# Patient Record
Sex: Female | Born: 1965
Health system: Southern US, Community
[De-identification: ages and names within clinical notes are randomized; demographics above are authoritative.]

## PROBLEM LIST (undated history)

## (undated) DIAGNOSIS — D649 Anemia, unspecified: Secondary | ICD-10-CM

## (undated) DIAGNOSIS — E119 Type 2 diabetes mellitus without complications: Secondary | ICD-10-CM

## (undated) DIAGNOSIS — I10 Essential (primary) hypertension: Secondary | ICD-10-CM

## (undated) DIAGNOSIS — K118 Other diseases of salivary glands: Secondary | ICD-10-CM

## (undated) DIAGNOSIS — D509 Iron deficiency anemia, unspecified: Secondary | ICD-10-CM

## (undated) DIAGNOSIS — R0789 Other chest pain: Secondary | ICD-10-CM

## (undated) HISTORY — DX: Anemia, unspecified: D64.9

## (undated) HISTORY — DX: Type 2 diabetes mellitus without complications: E11.9

## (undated) HISTORY — PX: TUBAL LIGATION: SHX77

## (undated) HISTORY — DX: Essential (primary) hypertension: I10

## (undated) HISTORY — DX: Other diseases of salivary glands: K11.8

## (undated) HISTORY — DX: Other chest pain: R07.89

## (undated) HISTORY — DX: Iron deficiency anemia, unspecified: D50.9

---

## 1997-11-02 ENCOUNTER — Other Ambulatory Visit: Admission: RE | Admit: 1997-11-02 | Discharge: 1997-11-02 | Payer: Self-pay | Admitting: Obstetrics

## 1997-11-23 ENCOUNTER — Ambulatory Visit (HOSPITAL_COMMUNITY): Admission: RE | Admit: 1997-11-23 | Discharge: 1997-11-23 | Payer: Self-pay | Admitting: Obstetrics

## 1998-03-12 ENCOUNTER — Ambulatory Visit (HOSPITAL_COMMUNITY): Admission: RE | Admit: 1998-03-12 | Discharge: 1998-03-12 | Payer: Self-pay | Admitting: Obstetrics

## 1998-06-12 ENCOUNTER — Inpatient Hospital Stay (HOSPITAL_COMMUNITY): Admission: AD | Admit: 1998-06-12 | Discharge: 1998-06-12 | Payer: Self-pay | Admitting: Obstetrics

## 1998-06-14 ENCOUNTER — Inpatient Hospital Stay (HOSPITAL_COMMUNITY): Admission: AD | Admit: 1998-06-14 | Discharge: 1998-06-17 | Payer: Self-pay | Admitting: Obstetrics

## 1998-06-18 ENCOUNTER — Encounter (HOSPITAL_COMMUNITY): Admission: RE | Admit: 1998-06-18 | Discharge: 1998-09-16 | Payer: Self-pay | Admitting: *Deleted

## 1998-09-16 ENCOUNTER — Encounter (HOSPITAL_COMMUNITY): Admission: RE | Admit: 1998-09-16 | Discharge: 1998-12-15 | Payer: Self-pay | Admitting: *Deleted

## 1998-12-16 ENCOUNTER — Encounter (HOSPITAL_COMMUNITY): Admission: RE | Admit: 1998-12-16 | Discharge: 1999-03-16 | Payer: Self-pay | Admitting: *Deleted

## 1999-03-20 ENCOUNTER — Encounter (HOSPITAL_COMMUNITY): Admission: RE | Admit: 1999-03-20 | Discharge: 1999-06-18 | Payer: Self-pay | Admitting: *Deleted

## 2000-05-15 HISTORY — PX: TUBAL LIGATION: SHX77

## 2000-05-21 ENCOUNTER — Other Ambulatory Visit: Admission: RE | Admit: 2000-05-21 | Discharge: 2000-05-21 | Payer: Self-pay | Admitting: Obstetrics

## 2000-08-08 ENCOUNTER — Ambulatory Visit (HOSPITAL_COMMUNITY): Admission: RE | Admit: 2000-08-08 | Discharge: 2000-08-08 | Payer: Self-pay | Admitting: Obstetrics

## 2000-08-08 ENCOUNTER — Encounter: Payer: Self-pay | Admitting: Obstetrics

## 2000-12-25 ENCOUNTER — Encounter (INDEPENDENT_AMBULATORY_CARE_PROVIDER_SITE_OTHER): Payer: Self-pay | Admitting: Specialist

## 2000-12-25 ENCOUNTER — Inpatient Hospital Stay (HOSPITAL_COMMUNITY): Admission: AD | Admit: 2000-12-25 | Discharge: 2000-12-27 | Payer: Self-pay | Admitting: Obstetrics

## 2000-12-28 ENCOUNTER — Encounter: Admission: RE | Admit: 2000-12-28 | Discharge: 2001-01-27 | Payer: Self-pay | Admitting: Obstetrics

## 2001-01-28 ENCOUNTER — Encounter: Admission: RE | Admit: 2001-01-28 | Discharge: 2001-02-27 | Payer: Self-pay | Admitting: Obstetrics

## 2001-03-30 ENCOUNTER — Encounter: Admission: RE | Admit: 2001-03-30 | Discharge: 2001-04-29 | Payer: Self-pay | Admitting: Obstetrics

## 2001-05-30 ENCOUNTER — Encounter: Admission: RE | Admit: 2001-05-30 | Discharge: 2001-06-29 | Payer: Self-pay | Admitting: Obstetrics

## 2001-06-30 ENCOUNTER — Encounter: Admission: RE | Admit: 2001-06-30 | Discharge: 2001-07-30 | Payer: Self-pay | Admitting: Obstetrics

## 2001-08-28 ENCOUNTER — Encounter: Admission: RE | Admit: 2001-08-28 | Discharge: 2001-09-27 | Payer: Self-pay | Admitting: Obstetrics

## 2001-10-28 ENCOUNTER — Encounter: Admission: RE | Admit: 2001-10-28 | Discharge: 2001-11-27 | Payer: Self-pay | Admitting: Obstetrics

## 2001-12-28 ENCOUNTER — Encounter: Admission: RE | Admit: 2001-12-28 | Discharge: 2002-01-27 | Payer: Self-pay | Admitting: Obstetrics

## 2002-01-28 ENCOUNTER — Encounter: Admission: RE | Admit: 2002-01-28 | Discharge: 2002-02-27 | Payer: Self-pay | Admitting: Obstetrics

## 2002-03-30 ENCOUNTER — Encounter: Admission: RE | Admit: 2002-03-30 | Discharge: 2002-04-29 | Payer: Self-pay | Admitting: Obstetrics

## 2002-05-30 ENCOUNTER — Encounter: Admission: RE | Admit: 2002-05-30 | Discharge: 2002-06-29 | Payer: Self-pay | Admitting: Obstetrics

## 2002-06-30 ENCOUNTER — Encounter: Admission: RE | Admit: 2002-06-30 | Discharge: 2002-07-30 | Payer: Self-pay | Admitting: Obstetrics

## 2002-08-29 ENCOUNTER — Encounter: Admission: RE | Admit: 2002-08-29 | Discharge: 2002-09-28 | Payer: Self-pay | Admitting: Obstetrics

## 2004-09-27 ENCOUNTER — Ambulatory Visit (HOSPITAL_COMMUNITY): Admission: RE | Admit: 2004-09-27 | Discharge: 2004-09-27 | Payer: Self-pay | Admitting: Obstetrics

## 2004-10-21 ENCOUNTER — Encounter: Admission: RE | Admit: 2004-10-21 | Discharge: 2004-10-21 | Payer: Self-pay | Admitting: Obstetrics

## 2005-05-04 ENCOUNTER — Encounter: Admission: RE | Admit: 2005-05-04 | Discharge: 2005-05-04 | Payer: Self-pay | Admitting: Obstetrics

## 2005-05-09 ENCOUNTER — Encounter (INDEPENDENT_AMBULATORY_CARE_PROVIDER_SITE_OTHER): Payer: Self-pay | Admitting: *Deleted

## 2005-05-09 ENCOUNTER — Encounter: Admission: RE | Admit: 2005-05-09 | Discharge: 2005-05-09 | Payer: Self-pay | Admitting: Obstetrics

## 2005-09-11 ENCOUNTER — Encounter: Admission: RE | Admit: 2005-09-11 | Discharge: 2005-09-11 | Payer: Self-pay | Admitting: Obstetrics

## 2006-09-14 ENCOUNTER — Encounter: Admission: RE | Admit: 2006-09-14 | Discharge: 2006-09-14 | Payer: Self-pay | Admitting: Obstetrics

## 2007-10-02 ENCOUNTER — Ambulatory Visit (HOSPITAL_COMMUNITY): Admission: RE | Admit: 2007-10-02 | Discharge: 2007-10-02 | Payer: Self-pay | Admitting: Obstetrics

## 2008-09-30 LAB — CONVERTED CEMR LAB: Pap Smear: NORMAL

## 2008-11-19 ENCOUNTER — Ambulatory Visit (HOSPITAL_COMMUNITY): Admission: RE | Admit: 2008-11-19 | Discharge: 2008-11-19 | Payer: Self-pay | Admitting: Obstetrics

## 2009-01-12 ENCOUNTER — Encounter: Payer: Self-pay | Admitting: Internal Medicine

## 2009-01-14 ENCOUNTER — Ambulatory Visit: Payer: Self-pay | Admitting: Internal Medicine

## 2009-01-14 DIAGNOSIS — E1159 Type 2 diabetes mellitus with other circulatory complications: Secondary | ICD-10-CM | POA: Insufficient documentation

## 2009-01-14 DIAGNOSIS — I152 Hypertension secondary to endocrine disorders: Secondary | ICD-10-CM | POA: Insufficient documentation

## 2009-01-14 DIAGNOSIS — I10 Essential (primary) hypertension: Secondary | ICD-10-CM | POA: Insufficient documentation

## 2009-04-05 ENCOUNTER — Ambulatory Visit: Payer: Self-pay | Admitting: Internal Medicine

## 2009-04-05 DIAGNOSIS — R0789 Other chest pain: Secondary | ICD-10-CM

## 2009-04-05 HISTORY — DX: Other chest pain: R07.89

## 2009-10-20 ENCOUNTER — Telehealth: Payer: Self-pay | Admitting: Internal Medicine

## 2009-10-21 ENCOUNTER — Encounter: Payer: Self-pay | Admitting: Internal Medicine

## 2009-10-21 LAB — CONVERTED CEMR LAB
BUN: 8 mg/dL (ref 6–23)
CO2: 28 meq/L (ref 19–32)
Calcium: 8.5 mg/dL (ref 8.4–10.5)
Glucose, Bld: 85 mg/dL (ref 70–99)
Sodium: 141 meq/L (ref 135–145)

## 2009-10-22 ENCOUNTER — Ambulatory Visit: Payer: Self-pay | Admitting: Internal Medicine

## 2009-12-09 ENCOUNTER — Ambulatory Visit (HOSPITAL_COMMUNITY): Admission: RE | Admit: 2009-12-09 | Discharge: 2009-12-09 | Payer: Self-pay | Admitting: Obstetrics

## 2010-01-21 ENCOUNTER — Ambulatory Visit: Payer: Self-pay | Admitting: Internal Medicine

## 2010-03-15 ENCOUNTER — Encounter: Payer: Self-pay | Admitting: Internal Medicine

## 2010-03-15 LAB — CONVERTED CEMR LAB
AST: 20 units/L (ref 0–37)
Albumin: 4 g/dL (ref 3.5–5.2)
Alkaline Phosphatase: 36 units/L — ABNORMAL LOW (ref 39–117)
Basophils Absolute: 0 10*3/uL (ref 0.0–0.1)
Bilirubin, Direct: 0.2 mg/dL (ref 0.0–0.3)
Creatinine, Ser: 0.72 mg/dL (ref 0.40–1.20)
Eosinophils Relative: 1 % (ref 0–5)
Glucose, Bld: 84 mg/dL (ref 70–99)
HCT: 34.8 % — ABNORMAL LOW (ref 36.0–46.0)
HDL: 49 mg/dL (ref >39)
Indirect Bilirubin: 0.4 mg/dL (ref 0.0–0.9)
LDL Cholesterol: 91 mg/dL (ref 0–99)
Lymphocytes Relative: 31 % (ref 12–46)
MCHC: 31.9 g/dL (ref 30.0–36.0)
Monocytes Absolute: 0.4 10*3/uL (ref 0.1–1.0)
Platelets: 248 10*3/uL (ref 150–400)
RBC: 4.29 M/uL (ref 3.87–5.11)
Sodium: 139 meq/L (ref 135–145)
Total Bilirubin: 0.6 mg/dL (ref 0.3–1.2)
Triglycerides: 49 mg/dL (ref <150)
WBC: 5.7 10*3/uL (ref 4.0–10.5)

## 2010-03-17 ENCOUNTER — Ambulatory Visit: Payer: Self-pay | Admitting: Internal Medicine

## 2010-03-18 ENCOUNTER — Telehealth: Payer: Self-pay | Admitting: Internal Medicine

## 2010-03-18 DIAGNOSIS — D509 Iron deficiency anemia, unspecified: Secondary | ICD-10-CM

## 2010-03-18 HISTORY — DX: Iron deficiency anemia, unspecified: D50.9

## 2010-05-17 ENCOUNTER — Ambulatory Visit: Admit: 2010-05-17 | Payer: Self-pay | Admitting: Internal Medicine

## 2010-06-05 ENCOUNTER — Encounter: Payer: Self-pay | Admitting: Obstetrics

## 2010-06-12 LAB — CONVERTED CEMR LAB
UIBC: 311 ug/dL
Vitamin B-12: 711 pg/mL (ref 211–911)

## 2010-06-16 NOTE — Progress Notes (Signed)
Summary: Iron Level  Phone Note Outgoing Call   Summary of Call: call pt - iron levels slightly low.  I suggest pt take OTC iron supplement.  pt should return in 2 months for cbc   Initial call taken by: D. Thomos Lemons DO,  March 18, 2010 5:04 PM  Follow-up for Phone Call        call placed to patient at [phone], no answer, no voice mail.  Follow-up by: Glendell Docker CMA,  March 21, 2010 9:37 AM  Additional Follow-up for Phone Call Additional follow up Details #1::        Pt notified. Order sent to lab for follow up CBC. Nicki Guadalajara Fergerson CMA (AAMA)  March 21, 2010 5:00 PM   New Problems: ANEMIA, IRON DEFICIENCY (ICD-280.9)   New Problems: ANEMIA, IRON DEFICIENCY (ICD-280.9)

## 2010-06-16 NOTE — Assessment & Plan Note (Signed)
Summary: cpx/dt   Vital Signs:  Patient profile:   45 year old female Height:      68 inches Weight:      181.75 pounds BMI:     27.73 O2 Sat:      99 % on Room air Temp:     98.5 degrees F oral Pulse rate:   74 / minute Pulse rhythm:   regular Resp:     18 per minute BP sitting:   126 / 80  (right arm) Cuff size:   large  Vitals Entered By: Glendell Docker CMA (March 17, 2010 8:55 AM)  O2 Flow:  Room air  Contraindications/Deferment of Procedures/Staging:    Test/Procedure: FLU VAX    Reason for deferment: patient declined  CC: CPX Is Patient Diabetic? No Pain Assessment Patient in pain? no        Primary Care Provider:  Dondra Spry DO  CC:  CPX.  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    Physical Exam  General:  alert, well-developed, and well-nourished.   Head:  normocephalic and atraumatic.   Ears:  R ear normal and L ear normal.   Mouth:  pharynx pink and moist.   Neck:  No deformities, masses, or tenderness noted.no carotid bruits.   Lungs:  normal respiratory effort and normal  breath sounds.   Heart:  normal rate, regular rhythm, no murmur, and no gallop.   Abdomen:  soft, non-tender, normal bowel sounds, no masses, no hepatomegaly, and no splenomegaly.   Neurologic:  cranial nerves II-XII intact and gait normal.   Psych:  normally interactive, good eye contact, not anxious appearing, and not depressed appearing.     Impression & Recommendations:  Problem # 1:  HEALTH MAINTENANCE EXAM (ICD-V70.0) Reviewed adult health maintenance protocols.  Mammogram: normal (11/02/2009) Pap smear: normal (11/23/2009) Td Booster: Tdap (01/14/2009)   Flu Vax: Declined (03/17/2010)   Chol: 150 (03/15/2010)   HDL: 49 (03/15/2010)   LDL: 91 (03/15/2010)   TG: 49 (03/15/2010) TSH: 1.484 (03/15/2010)     Problem # 2:  HYPERTENSION (ICD-401.9) Assessment: Improved  Her updated medication list for this problem includes:    Bisoprolol-hydrochlorothiazide 5-6.25 Mg Tabs (Bisoprolol-hydrochlorothiazide) .Marland Kitchen... Take 1 tablet by mouth once a day  BP today: 126/80 Prior BP: 148/92 (10/22/2009)  Labs Reviewed: K+: 4.1 (03/15/2010) Creat: : 0.72 (03/15/2010)   Chol: 150 (03/15/2010)   HDL: 49 (03/15/2010)   LDL: 91 (03/15/2010)   TG: 49 (03/15/2010)  Complete Medication List: 1)  Bisoprolol-hydrochlorothiazide  5-6.25 Mg Tabs (Bisoprolol-hydrochlorothiazide) .... Take 1 tablet by mouth once a day  Patient Instructions: 1)  Please schedule a follow-up appointment in 6 months. Prescriptions: BISOPROLOL-HYDROCHLOROTHIAZIDE 5-6.25 MG TABS (BISOPROLOL-HYDROCHLOROTHIAZIDE) Take 1 tablet by mouth once a day  #90 x 1   Entered and Authorized by:   D. Thomos Lemons DO   Signed by:   D. Thomos Lemons DO on 03/17/2010   Method used:   Electronically to        [facility]* (retail)       [address] [place], [place]  [address]       Ph: [phone]       Fax: [phone]   RxID:   [id number]    Orders Added: 1)  Est. Patient age 46-46  [99396]   Immunization History:  Influenza Immunization History:    Influenza:  declined (03/17/2010)   Immunization History:  Influenza Immunization History:    Influenza:  Declined (03/17/2010)  Current Allergies (reviewed today): No known allergies    Preventive Care Screening  Pap Smear:    Date:  11/23/2009    Results:  normal   Mammogram:    Date:  11/02/2009    Results:  normal

## 2010-06-16 NOTE — Miscellaneous (Signed)
Summary: Lab Orders  Clinical Lists Changes  Orders: Added new Test order of T-Basic Metabolic Panel ([id number]) - Signed 

## 2010-06-16 NOTE — Progress Notes (Signed)
Summary: Refill Denial  Phone Note Call from Patient Call back at Home Phone [phone]   Caller: Patient Reason for Call: Refill Medication Summary of Call: Pt called about refill,  she is out of meds & says she already has OV set up for August, would like to know if she can pu meds before OV Initial call taken by: Lannette Donath,  October 20, 2009 12:28 PM  Follow-up for Phone Call        attempted to contact patient at [phone], no answer. Detailed voice message left informing patient she will need office visit with labs prior to medication refills Follow-up by: Glendell Docker CMA,  October 20, 2009 1:46 PM

## 2010-06-16 NOTE — Assessment & Plan Note (Signed)
Summary: blood pressure meds/mhf   Vital Signs:  Patient profile:   45 year old female Height:      68 inches Weight:      177.75 pounds BMI:     27.12 O2 Sat:      100 % on o Temp:     98.0 degrees F oral Pulse rate:   92 / minute Pulse rhythm:   regular Resp:     16 per minute BP sitting:   148 / 92  (left arm) Cuff size:   regular  Vitals Entered By: Mervin Kung CMA (October 22, 2009 2:46 PM)  O2 Flow:  o CC: Follow up of blood pressure. Needs refill on Bisoprolol-HCTZ; has been out x 2 weeks. Is Patient Diabetic? No   Primary Care Provider:  Dondra Spry DO  CC:  Follow up of blood pressure. Needs refill on Bisoprolol-HCTZ; has been out x 2 weeks.Marland Kitchen  History of Present Illness:  Hypertension Follow-Up      This is a 45 year old woman who presents for Hypertension follow-up.  The patient denies lightheadedness and headaches.  The patient denies the following associated symptoms: chest pain.  Compliance with medications (by patient report) has been near 100%.  The patient reports that dietary compliance has been fair.    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   Lungs:  normal respiratory effort and normal breath sounds.   Heart:  normal rate, regular rhythm, no murmur, and no gallop.     Impression & Recommendations:  Problem # 1:  HYPERTENSION (ICD-401.9) she ran out of her meds.  restart  Her updated medication list for this problem includes:    Bisoprolol-hydrochlorothiazide 5-6.25 Mg Tabs (Bisoprolol-hydrochlorothiazide) .Marland Kitchen... Take 1 tablet by mouth once a day  BP today:  148/92 Prior BP: 138/82 (04/05/2009)  Labs Reviewed: K+: 3.8 (10/21/2009) Creat: : 0.81 (10/21/2009)     Complete Medication List: 1)  Bisoprolol-hydrochlorothiazide 5-6.25 Mg Tabs (Bisoprolol-hydrochlorothiazide) .... Take 1 tablet by mouth once a day  Patient Instructions: 1)  Please schedule a follow-up appointment in 6 months. 2)  Obtain BP cuff.   Prescriptions: BISOPROLOL-HYDROCHLOROTHIAZIDE 5-6.25 MG TABS (BISOPROLOL-HYDROCHLOROTHIAZIDE) Take 1 tablet by mouth once a day  #90 x 1   Entered and Authorized by:   D. Thomos Lemons DO   Signed by:   D. Thomos Lemons DO on 10/22/2009   Method used:   Electronically to        [facility]* (retail)       [address] [place], [place]  [address]       Ph: [phone]       Fax: [phone]   RxID:   [id number]   Current Allergies (reviewed today): No known allergies

## 2010-06-16 NOTE — Miscellaneous (Signed)
Summary: Orders Update  Clinical Lists Changes  Orders: Added new Test order of T-Basic Metabolic Panel ([id number]) - Signed Added new Test order of T-Hepatic Function ([id number]) - Signed Added new Test order of T-Lipid Profile ([id number]) - Signed Added new Test order of T-CBC w/Diff ([id number]) - Signed Added new Test order of T-TSH ([id number]) - Signed  

## 2010-07-05 ENCOUNTER — Emergency Department (HOSPITAL_BASED_OUTPATIENT_CLINIC_OR_DEPARTMENT_OTHER)
Admission: EM | Admit: 2010-07-05 | Discharge: 2010-07-05 | Disposition: A | Discharge status: Home / Self Care | Payer: Self-pay | Attending: Emergency Medicine | Admitting: Emergency Medicine

## 2010-07-05 DIAGNOSIS — R221 Localized swelling, mass and lump, neck: Secondary | ICD-10-CM | POA: Insufficient documentation

## 2010-07-05 DIAGNOSIS — K1120 Sialoadenitis, unspecified: Secondary | ICD-10-CM | POA: Insufficient documentation

## 2010-07-05 DIAGNOSIS — I10 Essential (primary) hypertension: Secondary | ICD-10-CM | POA: Insufficient documentation

## 2010-07-05 DIAGNOSIS — R220 Localized swelling, mass and lump, head: Secondary | ICD-10-CM | POA: Insufficient documentation

## 2010-09-30 NOTE — Op Note (Signed)
[facility] [place]  Patient:    Sarah Humphrey, Sarah Humphrey                      MRN: [id number] Proc. Date: 12/26/00 Adm. Date:  [id number] Attending:  Venita Sheffield                           Operative Report  PREOPERATIVE DIAGNOSIS:       Multiparity.  POSTOPERATIVE DIAGNOSIS:      Multiparity.  OPERATION:                    Postpartum tubal ligation.  SURGEON:                      Kathreen Cosier, M.D.  ANESTHESIA:                   General.  DESCRIPTION OF PROCEDURE:     Patient placed on operating table in supine position.  Her epidural did not work, so general anesthesia was administered by Dr. Arby Barrette.  Abdomen prepped and draped.  Bladder emptied with straight catheter.  A midline subumbilical incision 1 inch long was made, carried down to the fascia.  Fascia cleaned, grasped with two Kochers, and the fascia and peritoneum opened with the Mayo scissors.  The left tube was grasped in the mid portion with a Babcock clamp and traced to the fimbria.   The tube was noted to be very edematous.  A 0 plain suture placed in the mesosalpinx below the portion of the tube within the clamp.  This was tied and approximately 1 inch of tube transected.  The procedure was done in the exact fashion on the other side.  The right tube was also noted to be very edematous, and the tube was also traced to the fimbria.  Lap and sponge counts correct.  Abdomen closed in layers, peritoneum and fascia continuous 0 Dexon, skin closed with subcuticular stitch of 3-0 plain.  The patient tolerated the procedure well and was taken to the recovery room in good condition. DD:  12/26/00 TD:  12/26/00 Job: [id number]

## 2010-09-30 NOTE — H&P (Signed)
[facility]  Patient:    Sarah Humphrey, Sarah Humphrey                MRN: [id number] Adm. Date:  12/25/00 Attending:  Janine Limbo, M.D. Dictator:   Marcelle Smiling. Clelia Croft, C.N.M.                         History and Physical  DATE OF BIRTH:                1965-05-29  CHIEF COMPLAINT:              Ms. Sarah Humphrey is a 45 year old gravida 4 para 3, 0-0-3, at 40-2/7th weeks, who presents with regular uterine contractions for the last several hours.  She reports positive fetal movement. Denies vaginal bleeding, leaking, nausea and vomiting, or visual disturbance. Her pregnancy has been followed by Dr. Gaynell Face and is remarkable for a history of positive group B strep.  PRENATAL LABORATORY DATA:     Hemoglobin 11.2, hematocrit 34.9, platelets 184,000.  Blood type is AB-O, antibody negative.  Sickle cell trait negative. RPR nonreactive.  Hepatitis B surface antigen negative.  Pap smear within normal limits.  Gonorrhea negative.  Chlamydia negative.  One hour glucola on Sep 13, 2000 was 103.  HISTORY OF PRESENT PREGNANCY: She presented for care at approximately [redacted] weeks gestation.  Her prenatal care has been unremarkable.  OBSTETRICAL HISTORY:          1. In 1995 she vaginally delivered a female                                  infant at [redacted] weeks gestation, weighing                                  8 pounds.                               2. In 1998 she vaginally delivered a female                                  infant weighing 8 pounds 8 ounces at [redacted] weeks                                  gestation.                               3. In 1990 she vaginally delivered a female                                  infant at [redacted] weeks gestation without                                  complications.  ALLERGIES:                    No allergies to medications.  PAST MEDICAL HISTORY:  1. She reports having the usual childhood     illnesses.                               2. History of group B strep.  FAMILY HISTORY:               Unremarkable.  GENETIC HISTORY:              Unremarkable.  SOCIAL HISTORY:               She is married.  Her husband is involved and supportive.  They deny any alcohol, tobacco, or illicit drug use with the pregnancy.  PHYSICAL EXAMINATION:  VITAL SIGNS:                  Stable.  She is afebrile.  HEENT:                        Within normal limits.  CHEST:                        Clear to auscultation.  HEART:                        Regular rate and rhythm.  ABDOMEN:                      Gravid contour with uterine contractions every three minutes.  Fetal heart rate is reassuring.  PELVIC:                       Cervix is 6-7 cm, completely effaced, with vertex at -1 station with bulging bag of water.  EXTREMITIES:                  Within normal limits.  ASSESSMENT:                   1. Intrauterine pregnancy at term.                               2. Active labor.                               3. Positive group B streptococci.  PLAN:                         1. Admit to labor and delivery, for consult                                  with Dr. Normand Sloop.                               2. Routine M.D. orders.                               3. Penicillin for group B strep  prophylaxis. DD:  12/25/00 TD:  12/25/00 Job: [id number]

## 2010-09-30 NOTE — Discharge Summary (Signed)
[facility]  Patient:    Sarah Humphrey, Sarah Humphrey                      MRN: [id number] Adm. Date:  [id number] Disc. Date: 12/27/00 Attending:  Venita Sheffield                           Discharge Summary  HISTORY OF PRESENT ILLNESS: The patient is a 45 year old gravida 4 para 3, 0-0-3, [facility] December 23, 2000, admitted in labor, 9 cm dilated, the morning of December 25, 2000.  HOSPITAL COURSE: She had a normal vaginal delivery of a female, Apgars 9 and 9, weighing 9 pounds 10 ounces.  Postpartum the patient desired sterilization and on December 26, 2000 underwent postpartum tubal ligation.  On admission her hemoglobin was 10.9, WBC 8.2; post delivery 8.8 and 10.8.  She did well and was discharged home on the second postpartum day, ambulatory.  DISCHARGE DIET: Regular.  DISCHARGE MEDICATIONS: Tylox one q.4h p.r.n. FOLLOW-UP: To see me in six weeks.  DISCHARGE DIAGNOSES:  1. Status post normal vaginal delivery at term.  2. Postpartum tubal ligation. DD:  12/27/00 TD:  12/27/00 Job: [id number]

## 2010-11-07 ENCOUNTER — Other Ambulatory Visit: Payer: Self-pay | Admitting: Internal Medicine

## 2010-11-07 NOTE — Telephone Encounter (Signed)
Refill sent to pharmacy. Patient due for follow up

## 2013-12-23 ENCOUNTER — Encounter (HOSPITAL_BASED_OUTPATIENT_CLINIC_OR_DEPARTMENT_OTHER): Payer: Self-pay | Admitting: Emergency Medicine

## 2013-12-23 ENCOUNTER — Emergency Department (HOSPITAL_BASED_OUTPATIENT_CLINIC_OR_DEPARTMENT_OTHER)
Admission: EM | Admit: 2013-12-23 | Discharge: 2013-12-23 | Disposition: A | Discharge status: Home / Self Care | Payer: Self-pay | Attending: Emergency Medicine | Admitting: Emergency Medicine

## 2013-12-23 DIAGNOSIS — Y929 Unspecified place or not applicable: Secondary | ICD-10-CM | POA: Insufficient documentation

## 2013-12-23 DIAGNOSIS — Z79899 Other long term (current) drug therapy: Secondary | ICD-10-CM | POA: Insufficient documentation

## 2013-12-23 DIAGNOSIS — Y9389 Activity, other specified: Secondary | ICD-10-CM | POA: Insufficient documentation

## 2013-12-23 DIAGNOSIS — I10 Essential (primary) hypertension: Secondary | ICD-10-CM | POA: Insufficient documentation

## 2013-12-23 DIAGNOSIS — IMO0002 Reserved for concepts with insufficient information to code with codable children: Secondary | ICD-10-CM | POA: Insufficient documentation

## 2013-12-23 DIAGNOSIS — T169XXA Foreign body in ear, unspecified ear, initial encounter: Secondary | ICD-10-CM | POA: Insufficient documentation

## 2013-12-23 DIAGNOSIS — T162XXA Foreign body in left ear, initial encounter: Secondary | ICD-10-CM

## 2013-12-23 HISTORY — DX: Essential (primary) hypertension: I10

## 2013-12-23 NOTE — ED Notes (Signed)
MD at bedside. 

## 2013-12-23 NOTE — ED Notes (Signed)
Pt c/o cotton from q tip in left ear x 1 hr

## 2013-12-23 NOTE — Discharge Instructions (Signed)
Ear Foreign Body °An ear foreign body is an object that is stuck in the ear. It is common for young children to put objects into the ear canal. These may include pebbles, beads, beans, and any other small objects which will fit. In adults, objects such as cotton swabs may become lodged in the ear canal. In all ages, the most common foreign bodies are insects that enter the ear canal.  °SYMPTOMS  °Foreign bodies may cause pain, buzzing or roaring sounds, hearing loss, and ear drainage.  °HOME CARE INSTRUCTIONS  °· Keep all follow-up appointments with your caregiver as told. °· Keep small objects out of reach of young children. Tell them not to put anything in their ears. °SEEK IMMEDIATE MEDICAL CARE IF:  °· You have bleeding from the ear. °· You have increased pain or swelling of the ear. °· You have reduced hearing. °· You have discharge coming from the ear. °· You have a fever. °· You have a headache. °MAKE SURE YOU:  °· Understand these instructions. °· Will watch your condition. °· Will get help right away if you are not doing well or get worse. °Document Released: 04/28/2000 Document Revised: 07/24/2011 Document Reviewed: 12/18/2007 °ExitCare® Patient Information ©2015 [employer]. This information is not intended to replace advice given to you by your health care provider. Make sure you discuss any questions you have with your health care provider. ° °

## 2013-12-23 NOTE — ED Provider Notes (Signed)
CSN: [id number]     Arrival date & time 12/23/13  0009 History  This chart was scribed for Julianne Rice, MD by Jeanell Sparrow, ED Scribe. This patient was seen in room MH10/MH10 and the patient's care was started at 12:31 AM.   Chief Complaint  Patient presents with  . Foreign Body in Ear   HPI HPI Comments: Sarah Humphrey is a 48 y.o. female who presents to the Emergency Department complaining of a foreign body in her left ear that occurred about an hour ago. She states that a q tip got stuck in her left ear. She reports that she feel the q tip in her ear. She denies any ear pain or hearing changes  Past Medical History  Diagnosis Date  . Hypertension    Past Surgical History  Procedure Laterality Date  . Tubal ligation     History reviewed. No pertinent family history. History  Substance Use Topics  . Smoking status: Never Smoker   . Smokeless tobacco: Not on file  . Alcohol Use: No   OB History   Grav Para Term Preterm Abortions TAB SAB Ect Mult Living                 Review of Systems  HENT: Negative for ear discharge, ear pain and hearing loss.   All other systems reviewed and are negative.   Allergies  Review of patient's allergies indicates no known allergies.  Home Medications   Prior to Admission medications   Medication Sig Start Date End Date Taking? Authorizing Provider  bisoprolol-hydrochlorothiazide (ZIAC) 5-6.25 MG per tablet TAKE ONE TABLET BY MOUTH EVERY DAY 11/07/10   Doe-Hyun R Shawna Orleans, DO   BP 175/100  Pulse 74  Temp(Src) 97.8 F (36.6 C) (Oral)  Resp 16  Ht 5\' 7"  (1.702 m)  Wt 201 lb (91.173 kg)  BMI 31.47 kg/m2  SpO2 100%  LMP 12/23/2013 Physical Exam  Nursing note and vitals reviewed. Constitutional: She is oriented to person, place, and time. She appears well-developed and well-nourished. No distress.  HENT:  Head: Normocephalic and atraumatic.  Cotton ball visualized in the left external auditory meatus. No obvious surrounding erythema  or trauma.  Eyes: EOM are normal. Pupils are equal, round, and reactive to light.  Neck: Normal range of motion. Neck supple.  Cardiovascular: Normal rate.   Pulmonary/Chest: Effort normal.  Abdominal: Soft. Bowel sounds are normal.  Musculoskeletal: Normal range of motion. She exhibits no edema and no tenderness.  Neurological: She is alert and oriented to person, place, and time.  Skin: Skin is warm and dry. No rash noted. No erythema.  Psychiatric: She has a normal mood and affect. Her behavior is normal.    ED Course  Procedures (including critical care time) DIAGNOSTIC STUDIES: Oxygen Saturation is 100% on RA, normal by my interpretation.    COORDINATION OF CARE: 12:35 AM- Pt advised of plan for treatment and pt agrees.  Labs Review Labs Reviewed - No data to display  Imaging Review No results found.   EKG Interpretation None     Cotton ball removed in the emergency department with forceps. Re-exam: Left TM intact without any erythema or evidence of trauma. MDM   Final diagnoses:  None   I personally performed the services described in this documentation, which was scribed in my presence. The recorded information has been reviewed and is accurate.       Julianne Rice, MD 12/23/13 [phone]

## 2014-03-05 ENCOUNTER — Telehealth: Payer: Self-pay | Admitting: Internal Medicine

## 2014-04-28 ENCOUNTER — Encounter: Payer: Self-pay | Admitting: Family Medicine

## 2014-04-28 ENCOUNTER — Ambulatory Visit (INDEPENDENT_AMBULATORY_CARE_PROVIDER_SITE_OTHER): Payer: 59 | Admitting: Family Medicine

## 2014-04-28 VITALS — BP 128/90 | HR 67 | Temp 98.1°F | Ht 67.25 in | Wt 196.8 lb

## 2014-04-28 DIAGNOSIS — Z7689 Persons encountering health services in other specified circumstances: Secondary | ICD-10-CM

## 2014-04-28 DIAGNOSIS — Z7189 Other specified counseling: Secondary | ICD-10-CM

## 2014-04-28 DIAGNOSIS — I10 Essential (primary) hypertension: Secondary | ICD-10-CM

## 2014-04-28 DIAGNOSIS — D509 Iron deficiency anemia, unspecified: Secondary | ICD-10-CM

## 2014-04-28 DIAGNOSIS — M542 Cervicalgia: Secondary | ICD-10-CM

## 2014-04-28 LAB — CBC WITH DIFFERENTIAL/PLATELET
BASOS ABS: 0 10*3/uL (ref 0.0–0.1)
Basophils Relative: 0.5 % (ref 0.0–3.0)
EOS ABS: 0.1 10*3/uL (ref 0.0–0.7)
EOS PCT: 1.9 % (ref 0.0–5.0)
HEMATOCRIT: 33.2 % — AB (ref 36.0–46.0)
HEMOGLOBIN: 10.5 g/dL — AB (ref 12.0–15.0)
LYMPHS PCT: 26.7 % (ref 12.0–46.0)
Lymphs Abs: 1.9 10*3/uL (ref 0.7–4.0)
MCHC: 31.7 g/dL (ref 30.0–36.0)
MCV: 78.2 fl (ref 78.0–100.0)
MONOS PCT: 8.1 % (ref 3.0–12.0)
Monocytes Absolute: 0.6 10*3/uL (ref 0.1–1.0)
Neutro Abs: 4.4 10*3/uL (ref 1.4–7.7)
Neutrophils Relative %: 62.8 % (ref 43.0–77.0)
PLATELETS: 231 10*3/uL (ref 150.0–400.0)
RBC: 4.24 Mil/uL (ref 3.87–5.11)
RDW: 17.4 % — ABNORMAL HIGH (ref 11.5–15.5)
WBC: 7 10*3/uL (ref 4.0–10.5)

## 2014-04-28 LAB — BASIC METABOLIC PANEL
BUN: 13 mg/dL (ref 6–23)
CO2: 26 mEq/L (ref 19–32)
Calcium: 9.1 mg/dL (ref 8.4–10.5)
Chloride: 103 mEq/L (ref 96–112)
Creatinine, Ser: 0.8 mg/dL (ref 0.4–1.2)
GFR: 102.47 mL/min (ref >60.00)
Glucose, Bld: 81 mg/dL (ref 70–99)
POTASSIUM: 3.2 meq/L — AB (ref 3.5–5.1)
SODIUM: 136 meq/L (ref 135–145)

## 2014-04-28 LAB — LIPID PANEL
CHOLESTEROL: 150 mg/dL (ref 0–200)
HDL: 35.7 mg/dL — AB (ref >39.00)
LDL CALC: 101 mg/dL — AB (ref 0–99)
NonHDL: 114.3
TRIGLYCERIDES: 68 mg/dL (ref 0.0–149.0)
Total CHOL/HDL Ratio: 4
VLDL: 13.6 mg/dL (ref 0.0–40.0)

## 2014-04-28 LAB — HEMOGLOBIN A1C: Hgb A1c MFr Bld: 6.6 % — ABNORMAL HIGH (ref 4.6–6.5)

## 2014-04-28 NOTE — Patient Instructions (Signed)
BEFORE YOU LEAVE: -labs -follow up in 3-4 months  Schedule you gynecology annual exam  -We have ordered labs or studies at this visit. It can take up to 1-2 weeks for results and processing. We will contact you with instructions IF your results are abnormal. Normal results will be released to your [facility]. If you have not heard from [place] or can not find your results in [facility] in 2 weeks please contact our office.  -PLEASE SIGN UP FOR MYCHART TODAY   We recommend the following healthy lifestyle measures: - eat a healthy diet consisting of lots of vegetables, fruits, beans, nuts, seeds, healthy meats such as white chicken and fish and whole grains.  - avoid fried foods, fast food, processed foods, sodas, red meet and other fattening foods.  - get a least 150 minutes of aerobic exercise per week.

## 2014-04-28 NOTE — Progress Notes (Signed)
HPI:  Sarah Humphrey is here to establish care.  Last PCP and physical: does have a gynecologist -she is planning to continue her gyn exam there.  Has the following chronic problems that require follow up and concerns today:  Neck muscle strain: -for 1 week  -thinks slept wrong -pain and tension in muscles in neck and shoulders -improving -denies: weakness, numbness, radiation, malaise  HTN: -meds bisoprolol - hctz 5-6.25 -Denies: CP, SOB, DOE, swelling, palpitations  Hx iron def anemia: -chronic - dx many years ago, reports on iron pills in the past -does not eat red meat - healthy diet, some [place] or chicken -reports regular monthly periods, bleeds 4-5 days heavy, then slows down, sometimes does have bleeding requiring changing pads every 1-2 hours, but sometimes lighter, does not use tampons  ROS negative for unless reported above: fevers, unintentional weight loss, hearing or vision loss, chest pain, palpitations, struggling to breath, hemoptysis, melena, hematochezia, hematuria, falls, loc, si, thoughts of self harm  Past Medical History  Diagnosis Date  . Hypertension   . CHEST PAIN, ATYPICAL 04/05/2009    Qualifier: Diagnosis of  By: Wynona Luna   . ANEMIA, IRON DEFICIENCY 03/18/2010    Qualifier: Diagnosis of  By: Wynona Luna     Past Surgical History  Procedure Laterality Date  . Tubal ligation      History reviewed. No pertinent family history.  History   Social History  . Marital Status: Married    Spouse Name: N/A    Number of Children: N/A  . Years of Education: N/A   Social History Main Topics  . Smoking status: Never Smoker   . Smokeless tobacco: None  . Alcohol Use: No  . Drug Use: No  . Sexual Activity: No   Other Topics Concern  . 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  GENERAL: vitals reviewed and listed above, alert, oriented, appears well hydrated and in no acute distress  HEENT: atraumatic, conjunttiva clear, no obvious abnormalities on inspection of external nose and ears  NECK: no obvious masses on inspection  LUNGS: clear to auscultation bilaterally, no wheezes, rales or rhonchi, good air movement  CV: HRRR, no peripheral edema  MS: moves all extremities without noticeable abnormality  PSYCH: pleasant and cooperative, no obvious depression or anxiety  ASSESSMENT AND PLAN:  Discussed the following assessment and plan:  Encounter to establish care  Essential hypertension - Plan: Lipid Panel, Basic metabolic panel, Hemoglobin A1c  Iron deficiency anemia - Plan: CBC with Differential  Neck pain  -We reviewed the PMH, PSH, FH, SH, Meds and Allergies. -We provided refills for any medications we will prescribe as needed. -We addressed current concerns per orders and patient instructions. -We have asked for records for pertinent exams, studies, vaccines and notes from previous providers. -We have advised  patient to follow up per instructions below. -FASTING today so she opted to do basi labs related to her HTN and hx anemia    -Patient advised to return or notify a doctor immediately if symptoms worsen or persist or new concerns arise.  Patient Instructions  BEFORE YOU LEAVE: -labs -follow up in 3-4 months  Schedule you gynecology annual exam  -We have ordered labs or studies at this visit. It can take up to 1-2 weeks for results and processing. We will contact you with  instructions IF your results are abnormal. Normal results will be released to your [facility]. If you have not heard from [place] or can not find your results in [facility] in 2 weeks please contact our office.  -PLEASE SIGN UP FOR MYCHART TODAY   We recommend the following healthy lifestyle measures: - eat a healthy diet consisting of lots of vegetables, fruits, beans, nuts, seeds, healthy meats such as white chicken and fish and whole grains.  - avoid fried foods, fast food, processed foods, sodas, red meet and other fattening foods.  - get a least 150 minutes of aerobic exercise per week.        Colin Benton R.

## 2014-04-28 NOTE — Progress Notes (Signed)
Pre visit review using our clinic review tool, if applicable. No additional management support is needed unless otherwise documented below in the visit note. 

## 2014-04-29 ENCOUNTER — Telehealth: Payer: Self-pay | Admitting: Family Medicine

## 2014-04-29 ENCOUNTER — Other Ambulatory Visit: Payer: Self-pay | Admitting: Family Medicine

## 2014-04-29 MED ORDER — BISOPROLOL-HYDROCHLOROTHIAZIDE 5-6.25 MG PO TABS: 1.0000 | ORAL_TABLET | Freq: Every day | ORAL | Status: DC

## 2014-04-29 NOTE — Telephone Encounter (Signed)
emmi emailed °

## 2014-08-26 ENCOUNTER — Ambulatory Visit: Payer: Self-pay | Admitting: Family Medicine

## 2015-03-12 NOTE — Telephone Encounter (Signed)
error 

## 2015-04-14 ENCOUNTER — Other Ambulatory Visit: Payer: Self-pay | Admitting: Family Medicine

## 2015-06-01 ENCOUNTER — Ambulatory Visit (INDEPENDENT_AMBULATORY_CARE_PROVIDER_SITE_OTHER): Payer: Self-pay | Admitting: Family Medicine

## 2015-06-01 ENCOUNTER — Encounter: Payer: Self-pay | Admitting: Family Medicine

## 2015-06-01 VITALS — BP 144/98 | HR 91 | Temp 98.6°F | Ht 67.25 in | Wt 193.6 lb

## 2015-06-01 DIAGNOSIS — D509 Iron deficiency anemia, unspecified: Secondary | ICD-10-CM

## 2015-06-01 DIAGNOSIS — I10 Essential (primary) hypertension: Secondary | ICD-10-CM

## 2015-06-01 DIAGNOSIS — E785 Hyperlipidemia, unspecified: Secondary | ICD-10-CM

## 2015-06-01 DIAGNOSIS — E119 Type 2 diabetes mellitus without complications: Secondary | ICD-10-CM

## 2015-06-01 LAB — CBC WITH DIFFERENTIAL/PLATELET
BASOS ABS: 0 10*3/uL (ref 0.0–0.1)
BASOS PCT: 0.4 % (ref 0.0–3.0)
EOS ABS: 0.1 10*3/uL (ref 0.0–0.7)
Eosinophils Relative: 1.2 % (ref 0.0–5.0)
HEMATOCRIT: 37.8 % (ref 36.0–46.0)
HEMOGLOBIN: 12.1 g/dL (ref 12.0–15.0)
LYMPHS PCT: 37.6 % (ref 12.0–46.0)
Lymphs Abs: 2.3 10*3/uL (ref 0.7–4.0)
MCHC: 32 g/dL (ref 30.0–36.0)
MCV: 81.5 fl (ref 78.0–100.0)
Monocytes Absolute: 0.4 10*3/uL (ref 0.1–1.0)
Monocytes Relative: 6.2 % (ref 3.0–12.0)
Neutro Abs: 3.3 10*3/uL (ref 1.4–7.7)
Neutrophils Relative %: 54.6 % (ref 43.0–77.0)
Platelets: 254 10*3/uL (ref 150.0–400.0)
RBC: 4.63 Mil/uL (ref 3.87–5.11)
RDW: 15 % (ref 11.5–15.5)
WBC: 6 10*3/uL (ref 4.0–10.5)

## 2015-06-01 LAB — LIPID PANEL
CHOL/HDL RATIO: 4
Cholesterol: 142 mg/dL (ref 0–200)
HDL: 36.9 mg/dL — AB (ref >39.00)
LDL Cholesterol: 92 mg/dL (ref 0–99)
NONHDL: 105.38
Triglycerides: 67 mg/dL (ref 0.0–149.0)
VLDL: 13.4 mg/dL (ref 0.0–40.0)

## 2015-06-01 LAB — BASIC METABOLIC PANEL
BUN: 10 mg/dL (ref 6–23)
CHLORIDE: 103 meq/L (ref 96–112)
CO2: 31 mEq/L (ref 19–32)
CREATININE: 0.76 mg/dL (ref 0.40–1.20)
Calcium: 9.3 mg/dL (ref 8.4–10.5)
GFR: 103.56 mL/min (ref >60.00)
Glucose, Bld: 89 mg/dL (ref 70–99)
Potassium: 3.4 mEq/L — ABNORMAL LOW (ref 3.5–5.1)
Sodium: 140 mEq/L (ref 135–145)

## 2015-06-01 LAB — HEMOGLOBIN A1C: HEMOGLOBIN A1C: 6.2 % (ref 4.6–6.5)

## 2015-06-01 MED ORDER — BISOPROLOL-HYDROCHLOROTHIAZIDE 5-6.25 MG PO TABS: 1.0000 | ORAL_TABLET | Freq: Every day | ORAL | Status: DC

## 2015-06-01 NOTE — Progress Notes (Signed)
Pre visit review using our clinic review tool, if applicable. No additional management support is needed unless otherwise documented below in the visit note. 

## 2015-06-01 NOTE — Patient Instructions (Addendum)
BEFORE YOU LEAVE: -schedule physical exam in 3 months -labs  Restart your blood pressure medication  Get a yearly diabetic eye exam from an eye doctor  Get regular dental care  We recommend the following healthy lifestyle measures: - eat a healthy whole foods diet consisting of regular small meals composed of vegetables, fruits, beans, nuts, seeds, healthy meats such as white chicken and fish and whole grains.  - avoid sweets, white starchy foods, fried foods, fast food, processed foods, sodas, red meet and other fattening foods.  - get a least 150-300 minutes of aerobic exercise per week.   -We have ordered labs or studies at this visit. It can take up to 1-2 weeks for results and processing. We will contact you with instructions IF your results are abnormal. Normal results will be released to your [facility]. If you have not heard from [place] or can not find your results in [facility] in 2 weeks please contact our office.

## 2015-06-01 NOTE — Progress Notes (Signed)
HPI:  Sarah Humphrey is a pleasant 50 yo whom I have not seen in > 1 year here for a follow up visit for:  HTN: -meds bisoprolol - hctz 5-6.25 -reports ran out of BP medication -Denies: CP, SOB, DOE, swelling, palpitations  Diabetes/HLD: -she did not follow up after this dx, has been > 1 year -no regular exercise  -no change in diet, did not not see diabetes educator  Hx iron def anemia: -chronic - dx many years ago, reports on iron pills in the past -does not eat red meat - healthy diet, some [place] or chicken -reports regular monthly periods, bleeds 4-5 days heavy, then slows down, sometimes does have bleeding requiring changing pads every 1-2 hours, but sometimes lighter, does not use tampons -advise GI eval if not sig improve in month follow up but she did not follow up as advised -has been taking iron -no weakness, palpitations  ROS: See pertinent positives and negatives per HPI.  Past Medical History  Diagnosis Date  . Hypertension   . CHEST PAIN, ATYPICAL 04/05/2009    Qualifier: Diagnosis of  By: Wynona Luna   . ANEMIA, IRON DEFICIENCY 03/18/2010    Qualifier: Diagnosis of  By: Wynona Luna     Past Surgical History  Procedure Laterality Date  . Tubal ligation      No family history on file.  Social History   Social History  . Marital Status: Married    Spouse Name: N/A  . Number of Children: N/A  . Years of Education: N/A   Social History Main Topics  . Smoking status: Never Smoker   . Smokeless tobacco: None  . Alcohol Use: No  . Drug Use: No  . Sexual Activity: No   Other Topics Concern  . 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  bisoprolol-hydrochlorothiazide (ZIAC) 5-6.25 MG  tablet, Take 1 tablet by mouth daily., Disp: 90 tablet, Rfl: 0  EXAM:  Filed Vitals:   06/01/15 1311  BP: 144/98  Pulse: 91  Temp: 98.6 F (37 C)    Body mass index is 30.1 kg/(m^2).  GENERAL: vitals reviewed and listed above, alert, oriented, appears well hydrated and in no acute distress  HEENT: atraumatic, conjunttiva clear, no obvious abnormalities on inspection of external nose and ears  NECK: no obvious masses on inspection  LUNGS: clear to auscultation bilaterally, no wheezes, rales or rhonchi, good air movement  CV: HRRR, no peripheral edema  MS: moves all extremities without noticeable abnormality  PSYCH: pleasant and cooperative, no obvious depression or anxiety  ASSESSMENT AND PLAN:  Discussed the following assessment and plan:  Type 2 diabetes mellitus without complication, without long-term current use of insulin (HCC) - Plan: Hemoglobin A1c  Essential hypertension - Plan: Basic metabolic panel  Iron deficiency anemia - Plan: CBC with Differential  Hyperlipemia - Plan: Lipid panel  -reviewed prior labs and implications, risks, treatment of diabetes, HTN, HLD, anemia -lifestyle recs -restart medications -labs per orders - NOT fasting -advised physical exam in 3 months -Patient advised to return or notify a doctor immediately if symptoms  worsen or persist or new concerns arise.  Patient Instructions  BEFORE YOU LEAVE: -schedule physical in 3 months -labs  Restart your blood pressure medication  Get a yearly diabetic eye exam from an eye doctor  Get regular dental care  We recommend the following healthy lifestyle measures: - eat a healthy whole foods diet consisting of regular small meals composed of vegetables, fruits, beans, nuts, seeds, healthy meats such as white chicken and fish and whole grains.  - avoid sweets, white starchy foods, fried foods, fast food, processed foods, sodas, red meet and other fattening foods.  - get a least 150-300  minutes of aerobic exercise per week.   -We have ordered labs or studies at this visit. It can take up to 1-2 weeks for results and processing. We will contact you with instructions IF your results are abnormal. Normal results will be released to your [facility]. If you have not heard from [place] or can not find your results in [facility] in 2 weeks please contact our office.              Colin Benton R.

## 2015-06-16 ENCOUNTER — Encounter: Payer: Self-pay | Admitting: *Deleted

## 2015-08-25 ENCOUNTER — Telehealth: Payer: Self-pay

## 2015-08-25 NOTE — Telephone Encounter (Signed)
Pt said that she had been referred to [place] for her first colonoscopy. I told her the triage nurse does triages between 130-430pm and I would let her be aware. Please call [phone] ext [phone]

## 2015-08-30 NOTE — Telephone Encounter (Signed)
PLEASE NOTE: THIS WAS NOT THE PT THAT CALLED.  THE ONE THAT CALLED HAS A DATE OF BIRTH OF 01/27/1966.

## 2015-08-30 NOTE — Telephone Encounter (Signed)
Pt called to see if DS was available. I told her that DS had just gotten back to the office and would call her back shortly. She said to call her work number and she was asking if she could get procedure done this week if possible since her daughter was out of work this week and she could drive her.

## 2015-09-01 ENCOUNTER — Other Ambulatory Visit (HOSPITAL_COMMUNITY)
Admission: RE | Admit: 2015-09-01 | Discharge: 2015-09-01 | Disposition: A | Discharge status: Home / Self Care | Payer: Self-pay | Source: Ambulatory Visit | Attending: Family Medicine | Admitting: Family Medicine

## 2015-09-01 ENCOUNTER — Encounter: Payer: Self-pay | Admitting: Family Medicine

## 2015-09-01 ENCOUNTER — Ambulatory Visit (INDEPENDENT_AMBULATORY_CARE_PROVIDER_SITE_OTHER): Payer: Self-pay | Admitting: Family Medicine

## 2015-09-01 VITALS — BP 136/84 | HR 87 | Temp 98.8°F | Ht 68.0 in | Wt 196.2 lb

## 2015-09-01 DIAGNOSIS — E119 Type 2 diabetes mellitus without complications: Secondary | ICD-10-CM | POA: Insufficient documentation

## 2015-09-01 DIAGNOSIS — I10 Essential (primary) hypertension: Secondary | ICD-10-CM

## 2015-09-01 DIAGNOSIS — J309 Allergic rhinitis, unspecified: Secondary | ICD-10-CM

## 2015-09-01 DIAGNOSIS — Z1151 Encounter for screening for human papillomavirus (HPV): Secondary | ICD-10-CM | POA: Insufficient documentation

## 2015-09-01 DIAGNOSIS — Z23 Encounter for immunization: Secondary | ICD-10-CM

## 2015-09-01 DIAGNOSIS — Z299 Encounter for prophylactic measures, unspecified: Secondary | ICD-10-CM

## 2015-09-01 DIAGNOSIS — Z124 Encounter for screening for malignant neoplasm of cervix: Secondary | ICD-10-CM

## 2015-09-01 DIAGNOSIS — Z01419 Encounter for gynecological examination (general) (routine) without abnormal findings: Secondary | ICD-10-CM | POA: Insufficient documentation

## 2015-09-01 MED ORDER — BISOPROLOL-HYDROCHLOROTHIAZIDE 5-6.25 MG PO TABS: 1.0000 | ORAL_TABLET | Freq: Every day | ORAL | Status: DC

## 2015-09-01 NOTE — Progress Notes (Signed)
Pre visit review using our clinic review tool, if applicable. No additional management support is needed unless otherwise documented below in the visit note. 

## 2015-09-01 NOTE — Patient Instructions (Signed)
BEFORE YOU LEAVE: -cologuard order -pneumococcal -follow up appointment in 1 month regarding allergies and lymph nodes  -We placed a referral for you as discussed for your mammogram. It usually takes about 1-2 weeks to process and schedule this referral. If you have not heard from [place] regarding this appointment in 2 weeks please contact our office.  Please call to set up a annual diabetic eye exam. Have your eye doctor fax report to our office.  Please get a dental exam  -We have ordered a pa smear at this visit. It can take up to 1-2 weeks for results and processing. We will contact you with instructions IF your results are abnormal. Normal results will be released to your [facility]. If you have not heard from [place] or can not find your results in [facility] in 2 weeks please contact our office.  We recommend the following healthy lifestyle measures: - eat a healthy whole foods diet consisting of regular small meals composed of vegetables, fruits, beans, nuts, seeds, healthy meats such as white chicken and fish and whole grains.  - avoid sweets, white starchy foods, fried foods, fast food, processed foods, sodas, red meet and other fattening foods.  - get a least 150-300 minutes of aerobic exercise per week.    For the allergies: -AFRIN nasal spray twice daily for 5 days. Then stop. Do not use longer then 5 days. -Flonase 2 sprays each nostril for 21 days -claritin once daily for 1 month -these are available over the counter

## 2015-09-01 NOTE — Progress Notes (Signed)
HPI:  Sarah Humphrey is a pleasant 50 yo with a history of poor compliance with follow up, here for a CPE.  HTN: -meds bisoprolol - hctz 5-6.25 - just took medication prior to appt -Denies: CP, SOB, DOE, swelling, palpitations  Borderline Diabetes/HLD: -dx in 2015, poor compliance with follow up -trying to get more exercise -reports has made significant changes in diet, eating less sugar and less impulse starches, replaced rice with buckwheat  Hx iron def anemia: -chronic - dx many years ago, reports on iron pills in the past -resolved last check 05/2015  Submental lymphadenopathy: -History of, resolved with antibiotics in the past -Recurrent the last week, does have allergy symptoms with clear rhinorrhea, sneezing, postnasal drip - reports history of seasonal allergies  -has not seen dentist in a long time, denies any issues with fatigue -Denies fevers, sinus pain, ear pain  -Taking folic acid, vitamin D or calcium: no  -Diabetes and Dyslipidemia Screening: Done recently  - HTN: no  -Vaccines: pneumococcal do   -pap history: used to see Dr. Chauncey Cruel, gyn, but not done in 5 years, no hx abnormal paps per her report  -FDLMP: 08/07/15, menstrual cycle slightly early once in the last year   -sexual activity: yes, female partner, no new partners  -wants STI testing (Hep C if born [birth year]): no  -FH breast, colon or ovarian ca: see FH Last mammogram: reports last mammogram was 5 years ago  Last colon cancer screening: this has never been done   Breast Ca Risk Assessment: -No reported significant family history -she reports that she has a history of fibrocystic breast disease and has had a biopsy in the past for this but was negative   -Alcohol, Tobacco, drug use: see social history  Review of Systems - no fevers, unintentional weight loss, vision loss, hearing loss, chest pain, sob, hemoptysis, melena, hematochezia, hematuria, genital discharge, changing or concerning  skin lesions, bleeding, bruising, loc, thoughts of self harm or SI  Past Medical History  Diagnosis Date  . Hypertension   . CHEST PAIN, ATYPICAL 04/05/2009    Qualifier: Diagnosis of  By: Wynona Luna   . ANEMIA, IRON DEFICIENCY 03/18/2010    Qualifier: Diagnosis of  By: Wynona Luna     Past Surgical History  Procedure Laterality Date  . Tubal ligation      No family history on file.  Social History   Social History  . Marital Status: Married    Spouse Name: N/A  . Number of Children: N/A  . Years of Education: N/A   Social History Main Topics  . Smoking status: Never Smoker   . Smokeless tobacco: None  . Alcohol Use: No  . Drug Use: No  . Sexual Activity: No   Other Topics Concern  . 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  bisoprolol-hydrochlorothiazide (ZIAC) 5-6.25 MG tablet, Take 1 tablet by mouth daily., Disp: 90 tablet, Rfl: 3  EXAM:  Filed Vitals:   09/01/15 0841  BP: 136/84  Pulse: 87  Temp: 98.8 F (37.1 C)    GENERAL: vitals reviewed  and listed below, alert, oriented, appears well hydrated and in no acute distress  HEENT: head atraumatic, PERRLA, normal appearance of eyes, ears, nose and mouth. moist mucus membranes.  NECK: supple, no masses or lymphadenopathy  LUNGS: clear to auscultation bilaterally, no rales, rhonchi or wheeze  CV: HRRR, no peripheral edema or cyanosis, normal pedal pulses  BREAST: normal appearance - no lesions or discharge, on palpation normal breast tissue without any suspicious masses  ABDOMEN: bowel sounds normal, soft, non tender to palpation, no masses, no rebound or guarding  GU: normal appearance of external genitalia - no lesions or masses,  normal vaginal mucosa - no abnormal discharge, normal appearance of cervix - no lesions or abnormal discharge, no masses or tenderness on palpation of uterus and ovaries. Pap obtained.  RECTAL: refused  SKIN: no rash or abnormal lesions  MS: normal gait, moves all extremities normally  NEURO: CN II-XII grossly intact, normal muscle strength and sensation to light touch on extremities  PSYCH: normal affect, pleasant and cooperative  ASSESSMENT AND PLAN:  Discussed the following assessment and plan:  Encounter for preventive measure - Plan: MM Digital Screening Need for prophylactic vaccination against Streptococcus pneumoniae (pneumococcus) - Plan: Pneumococcal polysaccharide vaccine 23-valent greater than or equal to 2yo subcutaneous/IM -labs, studies and vaccines per orders this encounter -Discussed and advised all [place] preventive services health task force level A and B recommendations for age, sex and risks. -Advised at least 150 minutes of exercise per week and a healthy diet low in saturated fats and sweets and consisting of fresh fruits and vegetables, lean meats such as fish and white chicken and whole grains.  Essential hypertension -Continue medication, lifestyle recommendations, monitor  Type 2 diabetes mellitus without complication, without long-term current use of insulin ([place]) -Congratulated on lifestyle changes, monitor  Allergic rhinitis, unspecified allergic rhinitis type -Treat with short course nasal decongestion, Flonase and Claritin, advised follow-up if any symptoms persist in one month to recheck  Cervical cancer screening - Plan: PAP [[place]] -Also advised that she follow-up with her gynecologist if she has any further regular menstrual cycles or bleeding    Orders Placed This Encounter  Procedures  . MM Digital Screening    Ins? No problem with breast?/No hx of br ca?/no implants or reduction? Pf/no needs? Epic order/TG    Standing Status: Future      Number of Occurrences:      Standing Expiration Date: 10/31/2016    Order Specific Question:  Reason for Exam (SYMPTOM  OR DIAGNOSIS REQUIRED)    Answer:  screening    Order Specific Question:  Is the patient pregnant?    Answer:  No    Order Specific Question:  Preferred imaging location?    Answer:  [facility]  . Pneumococcal polysaccharide vaccine 23-valent greater than or equal to 2yo subcutaneous/IM    Patient advised to return to clinic immediately if symptoms worsen or persist or new concerns.  Patient Instructions  BEFORE YOU LEAVE: -cologuard order -pneumococcal -follow up appointment in 1 month regarding allergies and lymph nodes  -We placed a referral for you as discussed for your mammogram. It usually takes about 1-2 weeks to process and schedule this referral. If you have not heard from [place] regarding this appointment in 2 weeks please contact our office.  Please call to set up a annual diabetic eye exam. Have your eye doctor fax report to our office.  Please get a dental exam  -We have ordered a pa smear  at this visit. It can take up to 1-2 weeks for results and processing. We will contact you with instructions IF your results are abnormal. Normal results will be released to your [facility]. If you have not heard from [place] or can not find your results in [facility] in 2 weeks please contact our office.  We recommend the following healthy lifestyle measures: - eat a healthy whole foods diet consisting of regular small meals composed of vegetables, fruits, beans, nuts, seeds, healthy meats such as white chicken and fish and whole grains.  - avoid sweets, white starchy foods, fried foods, fast food, processed foods, sodas, red meet and other fattening foods.  - get a least 150-300 minutes of aerobic exercise per week.    For the allergies: -AFRIN nasal spray twice daily for 5 days. Then stop. Do not use longer then 5 days. -Flonase 2 sprays each nostril for 21  days -claritin once daily for 1 month -these are available over the counter            No Follow-up on file.  Colin Benton R.

## 2015-09-02 LAB — CYTOLOGY - PAP

## 2016-10-07 ENCOUNTER — Other Ambulatory Visit: Payer: Self-pay | Admitting: Family Medicine

## 2016-10-10 NOTE — Telephone Encounter (Signed)
Patient was last seen 09/01/15. This medication was last refilled 09/01/15 for #90 with 3 refills.  Is this ok to refill?

## 2016-10-11 NOTE — Telephone Encounter (Signed)
Please call pt. Schedule follow up appt asap in next 1 month and refill only to appt. Thanks.

## 2016-10-12 NOTE — Telephone Encounter (Signed)
I left message for patient to return phone call. I will route to [facility] for further follow up.

## 2016-10-13 NOTE — Telephone Encounter (Signed)
I left a message for the pt to return my call. 

## 2016-10-20 NOTE — Telephone Encounter (Signed)
Per Constance Holster pt has an appt scheduled for 6/21 and she is aware a 30 day supply was sent to her pharmacy.

## 2016-11-01 NOTE — Progress Notes (Deleted)
HPI:  Here for CPE:  Hx poor compliance with follow up, care recommendations, preventive care recs. Due for eye exam, mammo, cologard, hgba1c, foot exam, labs.  HTN: -meds bisoprolol - hctz 5-6.25  -Denies: CP, SOB, DOE, swelling, palpitations  Borderline Diabetes/HLD: -dx in 2015, poor compliance with follow up  Hx iron def anemia: -chronic - dx many years ago, reports on iron pills in the past -resolved last check 05/2015  -Concerns and/or follow up today: none  -Diet: variety of foods, balance and well rounded, larger portion sizes  -Exercise: no regular exercise  -Taking folic acid, vitamin D or calcium: no  -Diabetes and Dyslipidemia Screening:  -Hx of HTN: no  -Vaccines: UTD  -pap history: done 08/2015, normal  -FDLMP:  -sexual activity: yes, female partner, no new partners  -wants STI testing (Hep C if born [birth year]): no  -FH breast, colon or ovarian ca: see FH Last mammogram: > 5 years, ordered and advised last year, she did not go Last colon cancer screening: did not do the cologard she agreed to do at the last visit   -Alcohol, Tobacco, drug use: see social history  Review of Systems - no fevers, unintentional weight loss, vision loss, hearing loss, chest pain, sob, hemoptysis, melena, hematochezia, hematuria, genital discharge, changing or concerning skin lesions, bleeding, bruising, loc, thoughts of self harm or SI  Past Medical History:  Diagnosis Date  . ANEMIA, IRON DEFICIENCY 03/18/2010   Qualifier: Diagnosis of  By: Wynona Luna   . CHEST PAIN, ATYPICAL 04/05/2009   Qualifier: Diagnosis of  By: Wynona Luna   . Hypertension     Past Surgical History:  Procedure Laterality Date  . TUBAL LIGATION      No family history on file.  Social History   Social History  . Marital status: Married    Spouse name: N/A  . Number of children: N/A  . Years of education: N/A   Social History Main Topics  . Smoking status: Never Smoker   . Smokeless tobacco: Not on file  . Alcohol use No  . Drug use: No  . Sexual activity: No   Other Topics Concern  . 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  bisoprolol-hydrochlorothiazide (ZIAC) 5-6.25 MG tablet, TAKE ONE TABLET BY MOUTH ONCE DAILY, Disp: 30 tablet, Rfl: 0  EXAM:  There were no vitals filed for this visit.  GENERAL: vitals reviewed and listed below, alert, oriented, appears well hydrated and in no acute distress  HEENT: head atraumatic, PERRLA, normal appearance of eyes, ears, nose and mouth. moist mucus membranes.  NECK: supple, no masses or lymphadenopathy  LUNGS: clear to auscultation bilaterally, no rales, rhonchi or wheeze  CV: HRRR, no peripheral edema or cyanosis, normal pedal pulses  ABDOMEN: bowel sounds normal, soft, non tender to palpation, no masses, no rebound or guarding  SKIN: no rash or abnormal lesions  MS: normal gait, moves all extremities normally  NEURO: normal gait, speech and thought processing grossly intact, muscle tone grossly intact throughout  PSYCH: normal affect, pleasant and cooperative  ASSESSMENT AND PLAN:  Discussed the following assessment and plan:  There are no diagnoses linked to this encounter.  -Discussed and advised all [place] preventive services health task force level A and B recommendations for age, sex and risks.  -Advised at least 150 minutes of exercise per week and a healthy diet with avoidance of (less then 1 serving per week) processed foods, white starches, red meat, fast foods and sweets and consisting of: * 5-9 servings of fresh fruits and vegetables (not corn or potatoes) *nuts and seeds, beans *olives and olive oil *lean meats such as  fish and white chicken  *whole grains  -labs, studies and vaccines per orders this encounter  No orders of the defined types were placed in this encounter.   Patient advised to return to clinic immediately if symptoms worsen or persist or new concerns.  There are no Patient Instructions on file for this visit.  No Follow-up on file.  Colin Benton R., DO

## 2016-11-02 ENCOUNTER — Encounter: Payer: Self-pay | Admitting: Family Medicine

## 2016-12-11 ENCOUNTER — Telehealth: Payer: Self-pay | Admitting: Family Medicine

## 2016-12-11 MED ORDER — BISOPROLOL-HYDROCHLOROTHIAZIDE 5-6.25 MG PO TABS: 1.0000 | ORAL_TABLET | Freq: Every day | ORAL | 0 refills | Status: DC

## 2016-12-11 NOTE — Telephone Encounter (Signed)
Rx done. 

## 2016-12-11 NOTE — Telephone Encounter (Signed)
Pt has appt for CPE on 8/10 but is out of her bisoprolol-hydrochlorothiazide (ZIAC) 5-6.25 MG tablet  Would like a refill until she can get in to see Dr Maudie Mercury  [facility] [address] [place] - [place]

## 2016-12-22 ENCOUNTER — Encounter: Payer: Self-pay | Admitting: Gastroenterology

## 2016-12-22 ENCOUNTER — Encounter: Payer: Self-pay | Admitting: Family Medicine

## 2016-12-22 ENCOUNTER — Ambulatory Visit (INDEPENDENT_AMBULATORY_CARE_PROVIDER_SITE_OTHER): Payer: BLUE CROSS/BLUE SHIELD | Admitting: Family Medicine

## 2016-12-22 VITALS — BP 120/90 | HR 70 | Temp 98.5°F | Ht 68.0 in | Wt 198.6 lb

## 2016-12-22 DIAGNOSIS — I152 Hypertension secondary to endocrine disorders: Secondary | ICD-10-CM

## 2016-12-22 DIAGNOSIS — I10 Essential (primary) hypertension: Secondary | ICD-10-CM

## 2016-12-22 DIAGNOSIS — E119 Type 2 diabetes mellitus without complications: Secondary | ICD-10-CM | POA: Diagnosis not present

## 2016-12-22 DIAGNOSIS — R221 Localized swelling, mass and lump, neck: Secondary | ICD-10-CM | POA: Diagnosis not present

## 2016-12-22 DIAGNOSIS — Z0000 Encounter for general adult medical examination without abnormal findings: Secondary | ICD-10-CM | POA: Diagnosis not present

## 2016-12-22 DIAGNOSIS — Z1211 Encounter for screening for malignant neoplasm of colon: Secondary | ICD-10-CM

## 2016-12-22 DIAGNOSIS — E1159 Type 2 diabetes mellitus with other circulatory complications: Secondary | ICD-10-CM | POA: Diagnosis not present

## 2016-12-22 LAB — CBC
HEMATOCRIT: 35.7 % — AB (ref 36.0–46.0)
Hemoglobin: 11.3 g/dL — ABNORMAL LOW (ref 12.0–15.0)
MCHC: 31.7 g/dL (ref 30.0–36.0)
MCV: 81.6 fl (ref 78.0–100.0)
Platelets: 243 10*3/uL (ref 150.0–400.0)
RBC: 4.37 Mil/uL (ref 3.87–5.11)
RDW: 18.7 % — AB (ref 11.5–15.5)
WBC: 5.3 10*3/uL (ref 4.0–10.5)

## 2016-12-22 LAB — LIPID PANEL
CHOLESTEROL: 146 mg/dL (ref 0–200)
HDL: 41.4 mg/dL (ref >39.00)
LDL Cholesterol: 93 mg/dL (ref 0–99)
NonHDL: 104.41
Total CHOL/HDL Ratio: 4
Triglycerides: 59 mg/dL (ref 0.0–149.0)
VLDL: 11.8 mg/dL (ref 0.0–40.0)

## 2016-12-22 LAB — BASIC METABOLIC PANEL
BUN: 10 mg/dL (ref 6–23)
CALCIUM: 9.2 mg/dL (ref 8.4–10.5)
CHLORIDE: 103 meq/L (ref 96–112)
CO2: 33 meq/L — AB (ref 19–32)
CREATININE: 0.76 mg/dL (ref 0.40–1.20)
GFR: 102.92 mL/min (ref >60.00)
Glucose, Bld: 99 mg/dL (ref 70–99)
Potassium: 3.4 mEq/L — ABNORMAL LOW (ref 3.5–5.1)
Sodium: 140 mEq/L (ref 135–145)

## 2016-12-22 LAB — HEMOGLOBIN A1C: HEMOGLOBIN A1C: 6 % (ref 4.6–6.5)

## 2016-12-22 MED ORDER — LOSARTAN POTASSIUM 50 MG PO TABS: 50.0000 mg | ORAL_TABLET | Freq: Every day | ORAL | 3 refills | Status: DC

## 2016-12-22 NOTE — Patient Instructions (Addendum)
BEFORE YOU LEAVE: -follow up: 3-4 months -labs  Please add the new blood pressure medication Losartan 50 mg daily.  Please let us know the name of the doctor who did your diabetic eye exam so that we can obtain the eye exam report. Thanks! Please do your diabetic eye exam every 2 years.  Please call today to schedule your mammogram.  -We placed a referral for you as discussed to the Ear, nose and throat doctor about the lump on the neck and to the gastroenterologist for your colon cancer screening. It usually takes about 1-2 weeks to process and schedule this referral. If you have not heard from [place] regarding this appointment in 2 weeks please contact our office.  Health Maintenance, Female Adopting a healthy lifestyle and getting preventive care can go a long way to promote health and wellness. Talk with your health care provider about what schedule of regular examinations is right for you. This is a good chance for you to check in with your provider about disease prevention and staying healthy. In between checkups, there are plenty of things you can do on your own. Experts have done a lot of research about which lifestyle changes and preventive measures are most likely to keep you healthy. Ask your health care provider for more information. Weight and diet Eat a healthy diet  Be sure to include plenty of vegetables, fruits, low-fat dairy products, and lean protein.  Do not eat a lot of foods high in solid fats, added sugars, or salt.  Get regular exercise. This is one of the most important things you can do for your health. ? Most adults should exercise for at least 150 minutes each week. The exercise should increase your heart rate and make you sweat (moderate-intensity exercise). ? Most adults should also do strengthening exercises at least twice a week. This is in addition to the moderate-intensity exercise.  Maintain a healthy weight  Body mass index (BMI) is a measurement that can  be used to identify possible weight problems. It estimates body fat based on height and weight. Your health care provider can help determine your BMI and help you achieve or maintain a healthy weight.  For females 55 years of age and older: ? A BMI below 18.5 is considered underweight. ? A BMI of 18.5 to 24.9 is normal. ? A BMI of 25 to 29.9 is considered overweight. ? A BMI of 30 and above is considered obese.  Watch levels of cholesterol and blood lipids  You should start having your blood tested for lipids and cholesterol at 52 years of age, then have this test every 5 years.  You may need to have your cholesterol levels checked more often if: ? Your lipid or cholesterol levels are high. ? You are older than 52 years of age. ? You are at high risk for heart disease.  Cancer screening Lung Cancer  Lung cancer screening is recommended for adults 75-41 years old who are at high risk for lung cancer because of a history of smoking.  A yearly low-dose CT scan of the lungs is recommended for people who: ? Currently smoke. ? Have quit within the past 15 years. ? Have at least a 30-pack-year history of smoking. A pack year is smoking an average of one pack of cigarettes a day for 1 year.  Yearly screening should continue until it has been 15 years since you quit.  Yearly screening should stop if you develop a health problem that would  prevent you from having lung cancer treatment.  Breast Cancer  Practice breast self-awareness. This means understanding how your breasts normally appear and feel.  It also means doing regular breast self-exams. Let your health care provider know about any changes, no matter how small.  If you are in your 20s or 30s, you should have a clinical breast exam (CBE) by a health care provider every 1-3 years as part of a regular health exam.  If you are 41 or older, have a CBE every year. Also consider having a breast X-ray (mammogram) every year.  If you  have a family history of breast cancer, talk to your health care provider about genetic screening.  If you are at high risk for breast cancer, talk to your health care provider about having an MRI and a mammogram every year.  Breast cancer gene (BRCA) assessment is recommended for women who have family members with BRCA-related cancers. BRCA-related cancers include: ? Breast. ? Ovarian. ? Tubal. ? Peritoneal cancers.  Results of the assessment will determine the need for genetic counseling and BRCA1 and BRCA2 testing.  Cervical Cancer Your health care provider may recommend that you be screened regularly for cancer of the pelvic organs (ovaries, uterus, and vagina). This screening involves a pelvic examination, including checking for microscopic changes to the surface of your cervix (Pap test). You may be encouraged to have this screening done every 3 years, beginning at age 73.  For women ages 66-65, health care providers may recommend pelvic exams and Pap testing every 3 years, or they may recommend the Pap and pelvic exam, combined with testing for human papilloma virus (HPV), every 5 years. Some types of HPV increase your risk of cervical cancer. Testing for HPV may also be done on women of any age with unclear Pap test results.  Other health care providers may not recommend any screening for nonpregnant women who are considered low risk for pelvic cancer and who do not have symptoms. Ask your health care provider if a screening pelvic exam is right for you.  If you have had past treatment for cervical cancer or a condition that could lead to cancer, you need Pap tests and screening for cancer for at least 20 years after your treatment. If Pap tests have been discontinued, your risk factors (such as having a new sexual partner) need to be reassessed to determine if screening should resume. Some women have medical problems that increase the chance of getting cervical cancer. In these cases,  your health care provider may recommend more frequent screening and Pap tests.  Colorectal Cancer  This type of cancer can be detected and often prevented.  Routine colorectal cancer screening usually begins at 52 years of age and continues through 52 years of age.  Your health care provider may recommend screening at an earlier age if you have risk factors for colon cancer.  Your health care provider may also recommend using home test kits to check for hidden blood in the stool.  A small camera at the end of a tube can be used to examine your colon directly (sigmoidoscopy or colonoscopy). This is done to check for the earliest forms of colorectal cancer.  Routine screening usually begins at age 44.  Direct examination of the colon should be repeated every 5-10 years through 52 years of age. However, you may need to be screened more often if early forms of precancerous polyps or small growths are found.  Skin Cancer  Check your  skin from head to toe regularly.  Tell your health care provider about any new moles or changes in moles, especially if there is a change in a mole's shape or color.  Also tell your health care provider if you have a mole that is larger than the size of a pencil eraser.  Always use sunscreen. Apply sunscreen liberally and repeatedly throughout the day.  Protect yourself by wearing long sleeves, pants, a wide-brimmed hat, and sunglasses whenever you are outside.  Heart disease, diabetes, and high blood pressure  High blood pressure causes heart disease and increases the risk of stroke. High blood pressure is more likely to develop in: ? People who have blood pressure in the high end of the normal range (130-139/85-89 mm Hg). ? People who are overweight or obese. ? People who are African American.  If you are 18-43 years of age, have your blood pressure checked every 3-5 years. If you are 44 years of age or older, have your blood pressure checked every year.  You should have your blood pressure measured twice-once when you are at a hospital or clinic, and once when you are not at a hospital or clinic. Record the average of the two measurements. To check your blood pressure when you are not at a hospital or clinic, you can use: ? An automated blood pressure machine at a pharmacy. ? A home blood pressure monitor.  If you are between 60 years and 5 years old, ask your health care provider if you should take aspirin to prevent strokes.  Have regular diabetes screenings. This involves taking a blood sample to check your fasting blood sugar level. ? If you are at a normal weight and have a low risk for diabetes, have this test once every three years after 52 years of age. ? If you are overweight and have a high risk for diabetes, consider being tested at a younger age or more often. Preventing infection Hepatitis B  If you have a higher risk for hepatitis B, you should be screened for this virus. You are considered at high risk for hepatitis B if: ? You were born in a country where hepatitis B is common. Ask your health care provider which countries are considered high risk. ? Your parents were born in a high-risk country, and you have not been immunized against hepatitis B (hepatitis B vaccine). ? You have HIV or AIDS. ? You use needles to inject street drugs. ? You live with someone who has hepatitis B. ? You have had sex with someone who has hepatitis B. ? You get hemodialysis treatment. ? You take certain medicines for conditions, including cancer, organ transplantation, and autoimmune conditions.  Hepatitis C  Blood testing is recommended for: ? Everyone born from [birth year] through 1966. ? Anyone with known risk factors for hepatitis C.  Sexually transmitted infections (STIs)  You should be screened for sexually transmitted infections (STIs) including gonorrhea and chlamydia if: ? You are sexually active and are younger than 52 years of  age. ? You are older than 52 years of age and your health care provider tells you that you are at risk for this type of infection. ? Your sexual activity has changed since you were last screened and you are at an increased risk for chlamydia or gonorrhea. Ask your health care provider if you are at risk.  If you do not have HIV, but are at risk, it may be recommended that you take a prescription medicine daily  to prevent HIV infection. This is called pre-exposure prophylaxis (PrEP). You are considered at risk if: ? You are sexually active and do not regularly use condoms or know the HIV status of your partner(s). ? You take drugs by injection. ? You are sexually active with a partner who has HIV.  Talk with your health care provider about whether you are at high risk of being infected with HIV. If you choose to begin PrEP, you should first be tested for HIV. You should then be tested every 3 months for as long as you are taking PrEP. Pregnancy  If you are premenopausal and you may become pregnant, ask your health care provider about preconception counseling.  If you may become pregnant, take 400 to 800 micrograms (mcg) of folic acid every day.  If you want to prevent pregnancy, talk to your health care provider about birth control (contraception). Osteoporosis and menopause  Osteoporosis is a disease in which the bones lose minerals and strength with aging. This can result in serious bone fractures. Your risk for osteoporosis can be identified using a bone density scan.  If you are 53 years of age or older, or if you are at risk for osteoporosis and fractures, ask your health care provider if you should be screened.  Ask your health care provider whether you should take a calcium or vitamin D supplement to lower your risk for osteoporosis.  Menopause may have certain physical symptoms and risks.  Hormone replacement therapy may reduce some of these symptoms and risks. Talk to your health  care provider about whether hormone replacement therapy is right for you. Follow these instructions at home:  Schedule regular health, dental, and eye exams.  Stay current with your immunizations.  Do not use any tobacco products including cigarettes, chewing tobacco, or electronic cigarettes.  If you are pregnant, do not drink alcohol.  If you are breastfeeding, limit how much and how often you drink alcohol.  Limit alcohol intake to no more than 1 drink per day for nonpregnant women. One drink equals 12 ounces of beer, 5 ounces of wine, or 1 ounces of hard liquor.  Do not use street drugs.  Do not share needles.  Ask your health care provider for help if you need support or information about quitting drugs.  Tell your health care provider if you often feel depressed.  Tell your health care provider if you have ever been abused or do not feel safe at home. This information is not intended to replace advice given to you by your health care provider. Make sure you discuss any questions you have with your health care provider. Document Released: 11/14/2010 Document Revised: 10/07/2015 Document Reviewed: 02/02/2015 Elsevier Interactive Patient Education  [employer].

## 2016-12-22 NOTE — Progress Notes (Signed)
HPI:  Sarah Humphrey is a very pleasant 51 year old with a past medical history significant for diabetes and hypertension here for physical. However she has several things to address today as she has not been in to see [place] in quite some time. She is due for follow-up of her diabetes and her diabetic foot exam. She is due for all of her labs today. Her blood pressure is elevated and she would like to change her medications. She has not been exercising much or eating very healthy as she has been under a lot of stress for the last year related to infidelity issues with a friend. She has good faith in good support. Her children are doing well. She also has a mass on the right side of her neck. She reports it is intermittently larger and tender. She reports treatment in the emergency room at one point and was told this is mumps She brings a picture to show me what it looks like when it is large. She thinks it becomes enlarged several times a year. Reports in the past and has responded to antibiotics. She would like a referral regarding this. She reports she had her diabetic eye exam and was told that there was no retinopathy. She does not remember the name of the doctor in [facility] that she did this with.  -Taking folic acid, vitamin D or calcium: no  -Diabetes and Dyslipidemia Screening: Fasting for lab work  -Hx of HTN: no  -Vaccines: UTD  -pap history: Done 08/2015 and normal  -FDLMP: Not applicable  -sexual activity: yes, female partner, no new partners  -wants STI testing (Hep C if born [birth year]): no  -FH breast, colon or ovarian ca: see FH Last mammogram: She agrees to schedule, is past due, she reports she does self breast exams monthly without any concerns Last colon cancer screening: She was supposed to do the Cologuard, but reports she does not want to do this. She has decided she was doing a colonoscopy and agrees to referral today.  -Alcohol, Tobacco, drug use: see social  history  Review of Systems - no fevers, unintentional weight loss, vision loss, hearing loss, chest pain, sob, hemoptysis, melena, hematochezia, hematuria, genital discharge, changing or concerning skin lesions, bleeding, bruising, loc, thoughts of self harm or SI  Past Medical History:  Diagnosis Date  . ANEMIA, IRON DEFICIENCY 03/18/2010   Qualifier: Diagnosis of  By: Wynona Luna   . CHEST PAIN, ATYPICAL 04/05/2009   Qualifier: Diagnosis of  By: Wynona Luna   . Hypertension     Past Surgical History:  Procedure Laterality Date  . TUBAL LIGATION      No family history on file.  Social History   Social History  . Marital status: Married    Spouse name: N/A  . Number of children: N/A  . Years of education: N/A   Social History Main Topics  . Smoking status: Never Smoker  . Smokeless tobacco: Never Used  . Alcohol use No  . Drug use: No  . Sexual activity: No   Other Topics Concern  . 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  bisoprolol-hydrochlorothiazide (ZIAC) 5-6.25 MG tablet, Take 1 tablet by mouth daily., Disp: 30 tablet, Rfl: 0 .  losartan (COZAAR) 50 MG tablet, Take 1 tablet (50 mg total) by mouth daily., Disp: 90 tablet, Rfl: 3  EXAM:  Vitals:   12/22/16 1114  BP: 120/90  Pulse: 70  Temp: 98.5 F (36.9 C)    GENERAL: vitals reviewed and listed below, alert, oriented, appears well hydrated and in no acute distress  HEENT: head atraumatic, PERRLA, normal appearance of eyes, ears, nose and mouth. moist mucus membranes.  NECK: supple, no masses or lymphadenopathy, 1-2 cm mobile rubbery mass R ant upper neck, overlying small healing boil today on the skin  LUNGS: clear to auscultation  bilaterally, no rales, rhonchi or wheeze  CV: HRRR, no peripheral edema or cyanosis, normal pedal pulses  ABDOMEN: bowel sounds normal, soft, non tender to palpation, no masses, no rebound or guarding  BREAST: normal appearance - no skin lesions or discharge noted on inspection of both breasts, on palpation of both breast and axillary region no suspicious lesions appreciated today  GU: deferred  SKIN: no rash or abnormal lesions  MS: normal gait, moves all extremities normally  NEURO: normal gait, speech and thought processing grossly intact, muscle tone grossly intact throughout  PSYCH: normal affect, pleasant and cooperative  ASSESSMENT AND PLAN:  Discussed the following assessment and plan: Note: She had a number of issues and follow-up conditions to address today along with her physical.  Encounter for preventive health examination -Discussed and advised all [place] preventive services health task force level A and B recommendations for age, sex and risks. -Advised at least 150 minutes of exercise per week and a healthy diet with avoidance of (less then 1 serving per week) processed foods, white starches, red meat, fast foods and sweets and consisting of: * 5-9 servings of fresh fruits and vegetables (not corn or potatoes) *nuts and seeds, beans *olives and olive oil *lean meats such as fish and white chicken  *whole grains -Referral sent for colonoscopy -labs, studies and vaccines per orders this encounter  Type 2 diabetes mellitus without complication, without long-term current use of insulin (HCC) - Plan: Hemoglobin A1c, Lipid panel -Due for lab check -Diabetic foot exam done -Advised regular eye exams and advised her to let us know the name and contact information for the doctor who performed her last eye exam -Continue current medications pending lab results -Lifestyle recommendations  Hypertension associated with diabetes ([place]) - Plan: Basic metabolic panel,  CBC -Continue current medication and discussed options for further management, opted to add losartan, Rx sent -Follow up in 3 months  Mass in neck - Plan: Ambulatory referral to ENT -Referral to ENT placed  Colon cancer screening - Plan: Ambulatory referral to Gastroenterology  Thus discussed and supported her regarding stress and current relations. Doesn't seem that she needs medical intervention for this at this time and has good support.  Orders Placed This Encounter  Procedures  . Basic metabolic panel  . CBC  . Hemoglobin A1c  . Lipid panel  . Ambulatory referral to ENT    Referral Priority:   Routine    Referral Type:   Consultation    Referral Reason:   Specialty Services Required    Requested Specialty:   Otolaryngology    Number of Visits Requested:   1  . Ambulatory referral to Gastroenterology    Referral Priority:   Routine    Referral Type:  Consultation    Referral Reason:   Specialty Services Required    Number of Visits Requested:   1    Patient advised to return to clinic immediately if symptoms worsen or persist or new concerns.  Patient Instructions  BEFORE YOU LEAVE: -follow up: 3-4 months -labs  Please add the new blood pressure medication Losartan 50 mg daily.  Please let us know the name of the doctor who did your diabetic eye exam so that we can obtain the eye exam report. Thanks! Please do your diabetic eye exam every 2 years.  Please call today to schedule your mammogram.  -We placed a referral for you as discussed to the Ear, nose and throat doctor about the lump on the neck and to the gastroenterologist for your colon cancer screening. It usually takes about 1-2 weeks to process and schedule this referral. If you have not heard from [place] regarding this appointment in 2 weeks please contact our office.  Health Maintenance, Female Adopting a healthy lifestyle and getting preventive care can go a long way to promote health and wellness. Talk with  your health care provider about what schedule of regular examinations is right for you. This is a good chance for you to check in with your provider about disease prevention and staying healthy. In between checkups, there are plenty of things you can do on your own. Experts have done a lot of research about which lifestyle changes and preventive measures are most likely to keep you healthy. Ask your health care provider for more information. Weight and diet Eat a healthy diet  Be sure to include plenty of vegetables, fruits, low-fat dairy products, and lean protein.  Do not eat a lot of foods high in solid fats, added sugars, or salt.  Get regular exercise. This is one of the most important things you can do for your health. ? Most adults should exercise for at least 150 minutes each week. The exercise should increase your heart rate and make you sweat (moderate-intensity exercise). ? Most adults should also do strengthening exercises at least twice a week. This is in addition to the moderate-intensity exercise.  Maintain a healthy weight  Body mass index (BMI) is a measurement that can be used to identify possible weight problems. It estimates body fat based on height and weight. Your health care provider can help determine your BMI and help you achieve or maintain a healthy weight.  For females 19 years of age and older: ? A BMI below 18.5 is considered underweight. ? A BMI of 18.5 to 24.9 is normal. ? A BMI of 25 to 29.9 is considered overweight. ? A BMI of 30 and above is considered obese.  Watch levels of cholesterol and blood lipids  You should start having your blood tested for lipids and cholesterol at 52 years of age, then have this test every 5 years.  You may need to have your cholesterol levels checked more often if: ? Your lipid or cholesterol levels are high. ? You are older than 52 years of age. ? You are at high risk for heart disease.  Cancer screening Lung  Cancer  Lung cancer screening is recommended for adults 53-71 years old who are at high risk for lung cancer because of a history of smoking.  A yearly low-dose CT scan of the lungs is recommended for people who: ? Currently smoke. ? Have quit within the past 15 years. ? Have at least a 30-pack-year history of smoking.  A pack year is smoking an average of one pack of cigarettes a day for 1 year.  Yearly screening should continue until it has been 15 years since you quit.  Yearly screening should stop if you develop a health problem that would prevent you from having lung cancer treatment.  Breast Cancer  Practice breast self-awareness. This means understanding how your breasts normally appear and feel.  It also means doing regular breast self-exams. Let your health care provider know about any changes, no matter how small.  If you are in your 20s or 30s, you should have a clinical breast exam (CBE) by a health care provider every 1-3 years as part of a regular health exam.  If you are 76 or older, have a CBE every year. Also consider having a breast X-ray (mammogram) every year.  If you have a family history of breast cancer, talk to your health care provider about genetic screening.  If you are at high risk for breast cancer, talk to your health care provider about having an MRI and a mammogram every year.  Breast cancer gene (BRCA) assessment is recommended for women who have family members with BRCA-related cancers. BRCA-related cancers include: ? Breast. ? Ovarian. ? Tubal. ? Peritoneal cancers.  Results of the assessment will determine the need for genetic counseling and BRCA1 and BRCA2 testing.  Cervical Cancer Your health care provider may recommend that you be screened regularly for cancer of the pelvic organs (ovaries, uterus, and vagina). This screening involves a pelvic examination, including checking for microscopic changes to the surface of your cervix (Pap test). You  may be encouraged to have this screening done every 3 years, beginning at age 27.  For women ages 20-65, health care providers may recommend pelvic exams and Pap testing every 3 years, or they may recommend the Pap and pelvic exam, combined with testing for human papilloma virus (HPV), every 5 years. Some types of HPV increase your risk of cervical cancer. Testing for HPV may also be done on women of any age with unclear Pap test results.  Other health care providers may not recommend any screening for nonpregnant women who are considered low risk for pelvic cancer and who do not have symptoms. Ask your health care provider if a screening pelvic exam is right for you.  If you have had past treatment for cervical cancer or a condition that could lead to cancer, you need Pap tests and screening for cancer for at least 20 years after your treatment. If Pap tests have been discontinued, your risk factors (such as having a new sexual partner) need to be reassessed to determine if screening should resume. Some women have medical problems that increase the chance of getting cervical cancer. In these cases, your health care provider may recommend more frequent screening and Pap tests.  Colorectal Cancer  This type of cancer can be detected and often prevented.  Routine colorectal cancer screening usually begins at 52 years of age and continues through 52 years of age.  Your health care provider may recommend screening at an earlier age if you have risk factors for colon cancer.  Your health care provider may also recommend using home test kits to check for hidden blood in the stool.  A small camera at the end of a tube can be used to examine your colon directly (sigmoidoscopy or colonoscopy). This is done to check for the earliest forms of colorectal cancer.  Routine screening usually begins at age 33.  Direct examination of the colon should be repeated every 5-10 years through 52 years of age.  However, you may need to be screened more often if early forms of precancerous polyps or small growths are found.  Skin Cancer  Check your skin from head to toe regularly.  Tell your health care provider about any new moles or changes in moles, especially if there is a change in a mole's shape or color.  Also tell your health care provider if you have a mole that is larger than the size of a pencil eraser.  Always use sunscreen. Apply sunscreen liberally and repeatedly throughout the day.  Protect yourself by wearing long sleeves, pants, a wide-brimmed hat, and sunglasses whenever you are outside.  Heart disease, diabetes, and high blood pressure  High blood pressure causes heart disease and increases the risk of stroke. High blood pressure is more likely to develop in: ? People who have blood pressure in the high end of the normal range (130-139/85-89 mm Hg). ? People who are overweight or obese. ? People who are African American.  If you are 89-5 years of age, have your blood pressure checked every 3-5 years. If you are 61 years of age or older, have your blood pressure checked every year. You should have your blood pressure measured twice-once when you are at a hospital or clinic, and once when you are not at a hospital or clinic. Record the average of the two measurements. To check your blood pressure when you are not at a hospital or clinic, you can use: ? An automated blood pressure machine at a pharmacy. ? A home blood pressure monitor.  If you are between 50 years and 56 years old, ask your health care provider if you should take aspirin to prevent strokes.  Have regular diabetes screenings. This involves taking a blood sample to check your fasting blood sugar level. ? If you are at a normal weight and have a low risk for diabetes, have this test once every three years after 52 years of age. ? If you are overweight and have a high risk for diabetes, consider being tested at a  younger age or more often. Preventing infection Hepatitis B  If you have a higher risk for hepatitis B, you should be screened for this virus. You are considered at high risk for hepatitis B if: ? You were born in a country where hepatitis B is common. Ask your health care provider which countries are considered high risk. ? Your parents were born in a high-risk country, and you have not been immunized against hepatitis B (hepatitis B vaccine). ? You have HIV or AIDS. ? You use needles to inject street drugs. ? You live with someone who has hepatitis B. ? You have had sex with someone who has hepatitis B. ? You get hemodialysis treatment. ? You take certain medicines for conditions, including cancer, organ transplantation, and autoimmune conditions.  Hepatitis C  Blood testing is recommended for: ? Everyone born from [birth year] through 1966. ? Anyone with known risk factors for hepatitis C.  Sexually transmitted infections (STIs)  You should be screened for sexually transmitted infections (STIs) including gonorrhea and chlamydia if: ? You are sexually active and are younger than 52 years of age. ? You are older than 52 years of age and your health care provider tells you that you are at risk for this type of infection. ? Your sexual activity has changed since you were last screened and  you are at an increased risk for chlamydia or gonorrhea. Ask your health care provider if you are at risk.  If you do not have HIV, but are at risk, it may be recommended that you take a prescription medicine daily to prevent HIV infection. This is called pre-exposure prophylaxis (PrEP). You are considered at risk if: ? You are sexually active and do not regularly use condoms or know the HIV status of your partner(s). ? You take drugs by injection. ? You are sexually active with a partner who has HIV.  Talk with your health care provider about whether you are at high risk of being infected with HIV. If you  choose to begin PrEP, you should first be tested for HIV. You should then be tested every 3 months for as long as you are taking PrEP. Pregnancy  If you are premenopausal and you may become pregnant, ask your health care provider about preconception counseling.  If you may become pregnant, take 400 to 800 micrograms (mcg) of folic acid every day.  If you want to prevent pregnancy, talk to your health care provider about birth control (contraception). Osteoporosis and menopause  Osteoporosis is a disease in which the bones lose minerals and strength with aging. This can result in serious bone fractures. Your risk for osteoporosis can be identified using a bone density scan.  If you are 19 years of age or older, or if you are at risk for osteoporosis and fractures, ask your health care provider if you should be screened.  Ask your health care provider whether you should take a calcium or vitamin D supplement to lower your risk for osteoporosis.  Menopause may have certain physical symptoms and risks.  Hormone replacement therapy may reduce some of these symptoms and risks. Talk to your health care provider about whether hormone replacement therapy is right for you. Follow these instructions at home:  Schedule regular health, dental, and eye exams.  Stay current with your immunizations.  Do not use any tobacco products including cigarettes, chewing tobacco, or electronic cigarettes.  If you are pregnant, do not drink alcohol.  If you are breastfeeding, limit how much and how often you drink alcohol.  Limit alcohol intake to no more than 1 drink per day for nonpregnant women. One drink equals 12 ounces of beer, 5 ounces of wine, or 1 ounces of hard liquor.  Do not use street drugs.  Do not share needles.  Ask your health care provider for help if you need support or information about quitting drugs.  Tell your health care provider if you often feel depressed.  Tell your health  care provider if you have ever been abused or do not feel safe at home. This information is not intended to replace advice given to you by your health care provider. Make sure you discuss any questions you have with your health care provider. Document Released: 11/14/2010 Document Revised: 10/07/2015 Document Reviewed: 02/02/2015 Elsevier Interactive Patient Education  2018 [employer].      No Follow-up on file.  Colin Benton R., DO

## 2017-01-01 DIAGNOSIS — K118 Other diseases of salivary glands: Secondary | ICD-10-CM | POA: Diagnosis not present

## 2017-01-16 ENCOUNTER — Other Ambulatory Visit: Payer: Self-pay | Admitting: Family Medicine

## 2017-02-13 ENCOUNTER — Encounter: Payer: Self-pay | Admitting: Gastroenterology

## 2017-02-13 ENCOUNTER — Ambulatory Visit (AMBULATORY_SURGERY_CENTER): Payer: Self-pay

## 2017-02-13 VITALS — Ht 68.5 in | Wt 201.0 lb

## 2017-02-13 DIAGNOSIS — Z1211 Encounter for screening for malignant neoplasm of colon: Secondary | ICD-10-CM

## 2017-02-13 MED ORDER — NA SULFATE-K SULFATE-MG SULF 17.5-3.13-1.6 GM/177ML PO SOLN: ORAL | 0 refills | Status: DC

## 2017-02-13 NOTE — Progress Notes (Signed)
Per pt, no allergies to soy or egg products.Pt not taking any weight loss meds or using  O2 at home.   Pt refused Emmi video. 

## 2017-02-19 ENCOUNTER — Telehealth: Payer: Self-pay | Admitting: Gastroenterology

## 2017-02-19 NOTE — Telephone Encounter (Signed)
Pt is returning your call [phone]

## 2017-02-19 NOTE — Telephone Encounter (Signed)
Patient's prep $90, but she explained her husband did a prep that was cheaper. Offered pt the coupon and she still does not want to pay for the prep using the $50 coupon. miralax prep explained. Patient unable to come here to get new instructions so I did mail her Miralax instructions today. Pt aware.

## 2017-02-19 NOTE — Telephone Encounter (Signed)
Patient states her prep for colon on 10.16.18 is too expensive and would like something cheaper called into [employer] [facility]. Can leave detailed message on mobile number.

## 2017-02-19 NOTE — Telephone Encounter (Signed)
Called patient, no answer. Left message for patient to call us back.

## 2017-02-27 ENCOUNTER — Ambulatory Visit (AMBULATORY_SURGERY_CENTER): Payer: BLUE CROSS/BLUE SHIELD | Admitting: Gastroenterology

## 2017-02-27 ENCOUNTER — Encounter: Payer: Self-pay | Admitting: Gastroenterology

## 2017-02-27 VITALS — BP 133/85 | HR 78 | Temp 97.8°F | Resp 16 | Ht 68.5 in | Wt 201.0 lb

## 2017-02-27 DIAGNOSIS — Z1211 Encounter for screening for malignant neoplasm of colon: Secondary | ICD-10-CM

## 2017-02-27 DIAGNOSIS — K635 Polyp of colon: Secondary | ICD-10-CM

## 2017-02-27 DIAGNOSIS — D122 Benign neoplasm of ascending colon: Secondary | ICD-10-CM

## 2017-02-27 MED ORDER — SODIUM CHLORIDE 0.9 % IV SOLN: 500.0000 mL | INTRAVENOUS | Status: DC

## 2017-02-27 NOTE — Progress Notes (Signed)
Called to room to assist during endoscopic procedure.  Patient ID and intended procedure confirmed with present staff. Received instructions for my participation in the procedure from the performing physician.  

## 2017-02-27 NOTE — Op Note (Signed)
[place] Patient Name: Sarah Humphrey Procedure Date: 02/27/2017 8:19 AM MRN: [id number] Endoscopist: Remo Lipps P. Amandalee Lacap MD, MD Age: 51 Referring MD:  Date of Birth: 09-16-1965 Gender: Female Account #: [id number] Procedure:                Colonoscopy Indications:              Screening for colorectal malignant neoplasm, This                            is the patient's first colonoscopy Medicines:                Monitored Anesthesia Care Procedure:                Pre-Anesthesia Assessment:                           - Prior to the procedure, a History and Physical                            was performed, and patient medications and                            allergies were reviewed. The patient's tolerance of                            previous anesthesia was also reviewed. The risks                            and benefits of the procedure and the sedation                            options and risks were discussed with the patient.                            All questions were answered, and informed consent                            was obtained. Prior Anticoagulants: The patient has                            taken no previous anticoagulant or antiplatelet                            agents. ASA Grade Assessment: II - A patient with                            mild systemic disease. After reviewing the risks                            and benefits, the patient was deemed in                            satisfactory condition to undergo the procedure.  After obtaining informed consent, the colonoscope                            was passed under direct vision. Throughout the                            procedure, the patient's blood pressure, pulse, and                            oxygen saturations were monitored continuously. The                            Colonoscope was introduced through the anus and                            advanced to the  the cecum, identified by                            appendiceal orifice and ileocecal valve. The                            colonoscopy was performed without difficulty. The                            patient tolerated the procedure well. The quality                            of the bowel preparation was good. The ileocecal                            valve, appendiceal orifice, and rectum were                            photographed. Scope In: 8:23:02 AM Scope Out: 8:39:37 AM Scope Withdrawal Time: 0 hours 12 minutes 29 seconds  Total Procedure Duration: 0 hours 16 minutes 35 seconds  Findings:                 The perianal and digital rectal examinations were                            normal.                           A 4 mm polyp was found in the ileocecal valve. The                            polyp was sessile. The polyp was removed with a                            cold snare. Resection and retrieval were complete.                           A few medium-mouthed diverticula were found in the  sigmoid colon and ascending colon.                           The exam was otherwise without abnormality. Complications:            No immediate complications. Estimated blood loss:                            Minimal. Estimated Blood Loss:     Estimated blood loss was minimal. Impression:               - One 4 mm polyp at the ileocecal valve, removed                            with a cold snare. Resected and retrieved.                           - Diverticulosis in the sigmoid colon and in the                            ascending colon.                           - The examination was otherwise normal. Recommendation:           - Patient has a contact number available for                            emergencies. The signs and symptoms of potential                            delayed complications were discussed with the                            patient. Return to normal  activities tomorrow.                            Written discharge instructions were provided to the                            patient.                           - Resume previous diet.                           - Continue present medications.                           - Await pathology results.                           - Repeat colonoscopy is recommended for                            surveillance. The colonoscopy date will be  determined after pathology results from today's                            exam become available for review. Remo Lipps P. Brittiany Wiehe MD, MD 02/27/2017 8:45:00 AM This report has been signed electronically.

## 2017-02-27 NOTE — Progress Notes (Signed)
Pt's states no medical or surgical changes since previsit or office visit. 

## 2017-02-27 NOTE — Progress Notes (Signed)
CRNA aware of patient's blood pressure being high.

## 2017-02-27 NOTE — Patient Instructions (Signed)
YOU HAD AN ENDOSCOPIC PROCEDURE TODAY AT THE [facility]:   Refer to the procedure report that was given to you for any specific questions about what was found during the examination.  If the procedure report does not answer your questions, please call your gastroenterologist to clarify.  If you requested that your care partner not be given the details of your procedure findings, then the procedure report has been included in a sealed envelope for you to review at your convenience later.  YOU SHOULD EXPECT: Some feelings of bloating in the abdomen. Passage of more gas than usual.  Walking can help get rid of the air that was put into your GI tract during the procedure and reduce the bloating. If you had a lower endoscopy (such as a colonoscopy or flexible sigmoidoscopy) you may notice spotting of blood in your stool or on the toilet paper. If you underwent a bowel prep for your procedure, you may not have a normal bowel movement for a few days.  Please Note:  You might notice some irritation and congestion in your nose or some drainage.  This is from the oxygen used during your procedure.  There is no need for concern and it should clear up in a day or so.  SYMPTOMS TO REPORT IMMEDIATELY:   Following lower endoscopy (colonoscopy or flexible sigmoidoscopy):  Excessive amounts of blood in the stool  Significant tenderness or worsening of abdominal pains  Swelling of the abdomen that is new, acute  Fever of 100F or higher    For urgent or emergent issues, a gastroenterologist can be reached at any hour by calling [phone].   DIET:  We do recommend a small meal at first, but then you may proceed to your regular diet.  Drink plenty of fluids but you should avoid alcoholic beverages for 24 hours.  ACTIVITY:  You should plan to take it easy for the rest of today and you should NOT DRIVE or use heavy machinery until tomorrow (because of the sedation medicines used during the test).     FOLLOW UP: Our staff will call the number listed on your records the next business day following your procedure to check on you and address any questions or concerns that you may have regarding the information given to you following your procedure. If we do not reach you, we will leave a message.  However, if you are feeling well and you are not experiencing any problems, there is no need to return our call.  We will assume that you have returned to your regular daily activities without incident.  If any biopsies were taken you will be contacted by phone or by letter within the next 1-3 weeks.  Please call us at [phone] if you have not heard about the biopsies in 3 weeks.    SIGNATURES/CONFIDENTIALITY: You and/or your care partner have signed paperwork which will be entered into your electronic medical record.  These signatures attest to the fact that that the information above on your After Visit Summary has been reviewed and is understood.  Full responsibility of the confidentiality of this discharge information lies with you and/or your care-partner.   Resume medications. Information given on polyps and diverticulosis. 

## 2017-02-27 NOTE — Progress Notes (Signed)
Spontaneous respirations throughout. VSS. Resting comfortably. To PACU on room air. Report to  RN. 

## 2017-02-28 ENCOUNTER — Telehealth: Payer: Self-pay | Admitting: Family Medicine

## 2017-02-28 ENCOUNTER — Telehealth: Payer: Self-pay | Admitting: *Deleted

## 2017-02-28 NOTE — Telephone Encounter (Signed)
No answer. Number identifier. Message left to call if questions or concerns. 

## 2017-02-28 NOTE — Telephone Encounter (Signed)
° ° ° °  Pt said even with the bp med her bp has still been high. She is scheduled for an appt on 03/05/17.    losartan (COZAAR) 50 MG tablet

## 2017-02-28 NOTE — Telephone Encounter (Signed)
  Follow up Call-  Call back number 02/27/2017  Post procedure Call Back phone  # [phone]  Permission to leave phone message Yes  Some recent data might be hidden     No answer, left message

## 2017-03-01 ENCOUNTER — Encounter: Payer: Self-pay | Admitting: Gastroenterology

## 2017-03-01 NOTE — Telephone Encounter (Signed)
Call pt. See if needs sooner appt, or ok waiting to scheduled appt. Thanks.

## 2017-03-01 NOTE — Telephone Encounter (Signed)
I left a message for the pt to return my call. 

## 2017-03-04 ENCOUNTER — Encounter: Payer: Self-pay | Admitting: Family Medicine

## 2017-03-04 NOTE — Progress Notes (Signed)
HPI:  Acute visit for:  PMH DM, HTN, mild hypokalemia, chronic iron def anemia, poor compliance. Here for follow-up of her blood pressure. She misunderstood and stopped her other blood pressure medicine only start the losartan at her last visit. She reports she has had some elevated blood pressures and can feel this and doesn't feel well and her blood pressures are elevated. She wondered if it was the new medicine. She is eating healthier and thinks her sugars probably improving. She had a colonoscopy. She wants to get her flu shot at work. She agrees to schedule her mammogram. No chest pain, shortness of breath or trouble breathing. She occasionally has mild edema in her ankles.  ROS: See pertinent positives and negatives per HPI.  Past Medical History:  Diagnosis Date  . ANEMIA, IRON DEFICIENCY 03/18/2010   Qualifier: Diagnosis of  By: Wynona Luna   . CHEST PAIN, ATYPICAL 04/05/2009   Qualifier: Diagnosis of  By: Wynona Luna /per pt did not have any chest pain  . Diabetes mellitus without complication ([place])    borderline/ no meds per pt.  . Hypertension   . Salivary gland obstruction    -s/p eval with ENT, Dr. Wilburn Cornelia in 2018 for submandibular swelling    Past Surgical History:  Procedure Laterality Date  . TUBAL LIGATION  2002    Family History  Problem Relation Age of Onset  . Hypertension Mother   . Hypertension Brother     Social History   Social History  . Marital status: Married    Spouse name: N/A  . Number of children: N/A  . Years of education: N/A   Social History Main Topics  . Smoking status: Never Smoker  . Smokeless tobacco: Never Used  . Alcohol use No  . Drug use: No  . Sexual activity: No   Other Topics Concern  . 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  losartan (COZAAR) 50 MG tablet, Take 1 tablet (50 mg total) by mouth daily., Disp: 90 tablet, Rfl: 3 .  hydrochlorothiazide (MICROZIDE) 12.5 MG capsule, Take 1 capsule (12.5 mg total) by mouth daily., Disp: 30 capsule, Rfl: 3  EXAM:  Vitals:   03/05/17 0735 03/05/17 0743  BP: (!) 142/110 130/90  Pulse: 95   Temp: 98.4 F (36.9 C)   SpO2: 98%     Body mass index is 30.03 kg/m.  GENERAL: vitals reviewed and listed above, alert, oriented, appears well hydrated and in no acute distress  HEENT: atraumatic, conjunttiva clear, no obvious abnormalities on inspection of external nose and ears  NECK: no obvious masses on inspection  LUNGS: clear to auscultation bilaterally, no wheezes, rales or rhonchi, good air movement  CV: HRRR, no peripheral edema  MS: moves all extremities without noticeable abnormality  PSYCH: pleasant and cooperative, no obvious depression or anxiety  ASSESSMENT AND PLAN:  Discussed the following assessment and plan:  Type 2 diabetes mellitus without complication, without long-term current use of insulin (HCC)  Hypertension associated with diabetes ([place]) - Plan: Basic metabolic panel, CBC  BMI 21.1-94.1,DEYCX  -advised a healthy low  sugar diet and regular exercise -start low-dose hydrochlorothiazide and continue losartan -Labs today -Advised to schedule mammogram -Follow up in 1 month about her blood pressure -Patient advised to return or notify a doctor immediately if symptoms worsen or persist or new concerns arise.  Patient Instructions  BEFORE YOU LEAVE: -labs -follow up: 1 month about blood pressure  CONTINUE the losartan.  ADD the Hydrochlorothiazide for blood pressure and fluid  Continue to pursue a healthy low sugar diet and regular exercise.  Schedule your mammogram  We have ordered labs or studies at this visit. It can take up to 1-2 weeks for  results and processing. IF results require follow up or explanation, we will call you with instructions. Clinically stable results will be released to your [facility]. If you have not heard from [place] or cannot find your results in [facility] in 2 weeks please contact our office at [phone].  If you are not yet signed up for [facility], please consider signing up.   WE NOW OFFER   [facility] FAST TRACK!!!  SAME DAY Appointments for ACUTE CARE  Such as: Sprains, Injuries, cuts, abrasions, rashes, muscle pain, joint pain, back pain Colds, flu, sore throats, headache, allergies, cough, fever  Ear pain, sinus and eye infections Abdominal pain, nausea, vomiting, diarrhea, upset stomach Animal/insect bites  3 Easy Ways to Schedule: Walk-In Scheduling Call in scheduling Mychart Sign-up: [email or web address]                Colin Benton R., DO

## 2017-03-05 ENCOUNTER — Ambulatory Visit (INDEPENDENT_AMBULATORY_CARE_PROVIDER_SITE_OTHER): Payer: BLUE CROSS/BLUE SHIELD | Admitting: Family Medicine

## 2017-03-05 ENCOUNTER — Encounter: Payer: Self-pay | Admitting: Family Medicine

## 2017-03-05 VITALS — BP 130/90 | HR 95 | Temp 98.4°F | Ht 68.5 in | Wt 200.4 lb

## 2017-03-05 DIAGNOSIS — E1159 Type 2 diabetes mellitus with other circulatory complications: Secondary | ICD-10-CM | POA: Diagnosis not present

## 2017-03-05 DIAGNOSIS — E119 Type 2 diabetes mellitus without complications: Secondary | ICD-10-CM

## 2017-03-05 DIAGNOSIS — Z6830 Body mass index (BMI) 30.0-30.9, adult: Secondary | ICD-10-CM | POA: Diagnosis not present

## 2017-03-05 DIAGNOSIS — I10 Essential (primary) hypertension: Secondary | ICD-10-CM | POA: Diagnosis not present

## 2017-03-05 DIAGNOSIS — I152 Hypertension secondary to endocrine disorders: Secondary | ICD-10-CM

## 2017-03-05 LAB — CBC
HCT: 36.4 % (ref 36.0–46.0)
HEMOGLOBIN: 11.7 g/dL — AB (ref 12.0–15.0)
MCHC: 32.2 g/dL (ref 30.0–36.0)
MCV: 82.8 fl (ref 78.0–100.0)
Platelets: 275 10*3/uL (ref 150.0–400.0)
RBC: 4.39 Mil/uL (ref 3.87–5.11)
RDW: 14.9 % (ref 11.5–15.5)
WBC: 6.2 10*3/uL (ref 4.0–10.5)

## 2017-03-05 LAB — BASIC METABOLIC PANEL
BUN: 11 mg/dL (ref 6–23)
CO2: 32 mEq/L (ref 19–32)
Calcium: 9.4 mg/dL (ref 8.4–10.5)
Chloride: 102 mEq/L (ref 96–112)
Creatinine, Ser: 0.67 mg/dL (ref 0.40–1.20)
GFR: 118.94 mL/min (ref >60.00)
Glucose, Bld: 113 mg/dL — ABNORMAL HIGH (ref 70–99)
POTASSIUM: 3.4 meq/L — AB (ref 3.5–5.1)
SODIUM: 141 meq/L (ref 135–145)

## 2017-03-05 MED ORDER — HYDROCHLOROTHIAZIDE 12.5 MG PO CAPS: 12.5000 mg | ORAL_CAPSULE | Freq: Every day | ORAL | 3 refills | Status: DC

## 2017-03-05 NOTE — Patient Instructions (Signed)
BEFORE YOU LEAVE: -labs -follow up: 1 month about blood pressure  CONTINUE the losartan.  ADD the Hydrochlorothiazide for blood pressure and fluid  Continue to pursue a healthy low sugar diet and regular exercise.  Schedule your mammogram  We have ordered labs or studies at this visit. It can take up to 1-2 weeks for results and processing. IF results require follow up or explanation, we will call you with instructions. Clinically stable results will be released to your [facility]. If you have not heard from [place] or cannot find your results in [facility] in 2 weeks please contact our office at [phone].  If you are not yet signed up for [facility], please consider signing up.   WE NOW OFFER   [facility] FAST TRACK!!!  SAME DAY Appointments for ACUTE CARE  Such as: Sprains, Injuries, cuts, abrasions, rashes, muscle pain, joint pain, back pain Colds, flu, sore throats, headache, allergies, cough, fever  Ear pain, sinus and eye infections Abdominal pain, nausea, vomiting, diarrhea, upset stomach Animal/insect bites  3 Easy Ways to Schedule: Walk-In Scheduling Call in scheduling Mychart Sign-up: [email or web address]

## 2017-03-05 NOTE — Telephone Encounter (Signed)
Patient was seen in the office today for BP and dizziness.

## 2017-03-06 ENCOUNTER — Other Ambulatory Visit: Payer: Self-pay | Admitting: Family Medicine

## 2017-03-06 DIAGNOSIS — Z1231 Encounter for screening mammogram for malignant neoplasm of breast: Secondary | ICD-10-CM

## 2017-03-28 ENCOUNTER — Ambulatory Visit
Admission: RE | Admit: 2017-03-28 | Discharge: 2017-03-28 | Disposition: A | Discharge status: Home / Self Care | Payer: BLUE CROSS/BLUE SHIELD | Source: Ambulatory Visit | Attending: Family Medicine | Admitting: Family Medicine

## 2017-03-28 DIAGNOSIS — Z1231 Encounter for screening mammogram for malignant neoplasm of breast: Secondary | ICD-10-CM | POA: Diagnosis not present

## 2017-04-02 NOTE — Progress Notes (Signed)
HPI:  Follow-up hypertension:  -She was to continue the losartan and add hydrochlorothiazide low-dose for her blood pressure and her  LE edema at her last visit about a month ago -Reports: Doing great, resolution of ankle edema, tolerating new medication well -Mild hypokalemia labs -Denies: Chest pain, palpitations, difficulty breathing -We will need to check her basic metabolic panel, may need to increase the losartan  A history of a swollen gland in her neck: -We referred her to ear nose and throat for this, she had reported a resolved -Today she reports this has not resolved, she thinks it is intermittent, reports the ear nose and throat doctor asked her to follow-up if was persistent -She has seen her dentist about this as well -no fevers, malaise or dysphagia  ROS: See pertinent positives and negatives per HPI.  Past Medical History:  Diagnosis Date  . ANEMIA, IRON DEFICIENCY 03/18/2010   Qualifier: Diagnosis of  By: Wynona Luna   . CHEST PAIN, ATYPICAL 04/05/2009   Qualifier: Diagnosis of  By: Wynona Luna /per pt did not have any chest pain  . Diabetes mellitus without complication ([place])    borderline/ no meds per pt.  . Hypertension   . Salivary gland obstruction    -s/p eval with ENT, Dr. Wilburn Cornelia in 2018 for submandibular swelling    Past Surgical History:  Procedure Laterality Date  . TUBAL LIGATION  2002    Family History  Problem Relation Age of Onset  . Hypertension Mother   . Hypertension Brother     Social History   Socioeconomic History  . Marital status: Married    Spouse name: None  . Number of children: None  . Years of education: None  . Highest education level: None  Social Needs  . Financial resource strain: None  . Food insecurity - worry: None  . Food insecurity - inability: None  . Transportation needs - medical: None  . Transportation needs - non-medical: None  Occupational History  . None  Tobacco Use  . Smoking  status: Never Smoker  . Smokeless tobacco: Never Used  Substance and Sexual Activity  . Alcohol use: No  . Drug use: No  . Sexual activity: No    Birth control/protection: Surgical  Other Topics Concern  . 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  hydrochlorothiazide (MICROZIDE) 12.5 MG capsule, Take 1 capsule (12.5 mg total) by mouth daily., Disp: 30 capsule, Rfl: 3 .  losartan (COZAAR) 100 MG tablet, Take 1 tablet (100 mg total) daily by mouth., Disp: 90 tablet, Rfl: 3  EXAM:  Vitals:   04/03/17 0728  BP: 136/90  Pulse: 97  Temp: 98.5 F (36.9 C)    Body mass index is 30.45 kg/m.  GENERAL: vitals reviewed and listed above, alert, oriented, appears well hydrated and in no acute distress  HEENT: atraumatic, conjunttiva clear, no obvious abnormalities on inspection of external nose and ears  NECK: no obvious masses on inspection, large submandibular gland on the right  LUNGS: clear to auscultation bilaterally, no wheezes, rales or rhonchi, good air movement  CV: HRRR, no peripheral edema  MS: moves all extremities without noticeable abnormality  PSYCH: pleasant and cooperative, no obvious depression or anxiety  ASSESSMENT AND PLAN:  Discussed the following assessment and plan:  Hypertension, unspecified type - Plan: Basic metabolic panel  Lower extremity edema  Hypokalemia  Neck mass  -For her blood pressure, we will increase the losartan further, advised a healthy diet and regular exercise as well, will check a BMP today -Lower extremity edema, but we will need to monitor the potassium -Advised that she follow-up with the ear nose and throat specialist about the neck lump, she agrees to call, I did print the  number for her in patient handout and advised to do so promptly -follow up 3-4 months -Patient advised to return or notify a doctor immediately if symptoms worsen or persist or new concerns arise.  Patient Instructions  BEFORE YOU LEAVE: -Lab  -follow up: Follow-up 3-4 months  Please increase the losartan to 100 mg daily.  I sent a new dose of this to your pharmacy.  You may take 2 of the 50 mg tablets to use them up, but the new prescription will be for 100 mg tablets.  Continue the hydrochlorothiazide.  Call the ear nose and throat doctor to follow-up on the lump in the neck: [facility] [place] [address] Phone [phone]  We have ordered labs or studies at this visit. It can take up to 1-2 weeks for results and processing. IF results require follow up or explanation, we will call you with instructions. Clinically stable results will be released to your [facility]. If you have not heard from [place] or cannot find your results in [facility] in 2 weeks please contact our office at [phone].  If you are not yet signed up for [facility], please consider signing up.   We recommend the following healthy lifestyle for LIFE: 1) Small portions. But, make sure to get regular (at least 3 per day), healthy meals and small healthy snacks if needed.  2) Eat a healthy clean diet.   TRY TO EAT: -at least 5-7 servings of low sugar, colorful, and nutrient rich vegetables per day (not corn, potatoes or bananas.) -berries are the best choice if you wish to eat fruit (only eat small amounts if trying to reduce weight)  -lean meets (fish, white meat of chicken or [place]) -vegan proteins for some meals - beans or tofu, whole grains, nuts and seeds -Replace bad fats with good fats - good fats include: fish, nuts and seeds, canola oil, olive oil -small amounts of low fat or non fat dairy -small amounts of100 % whole grains - check the lables -drink plenty of  water  AVOID: -SUGAR, sweets, anything with added sugar, corn syrup or sweeteners - must read labels as even foods advertised as "healthy" often are loaded with sugar -if you must have a sweetener, small amounts of stevia may be best -sweetened beverages and artificially sweetened beverages -simple starches (rice, bread, potatoes, pasta, chips, etc - small amounts of 100% whole grains are ok) -red meat, pork, butter -fried foods, fast food, processed food, excessive dairy, eggs and coconut.  3)Get at least 150 minutes of sweaty aerobic exercise per week.  4)Reduce stress - consider counseling, meditation and relaxation to balance other aspects of your life.  WE NOW OFFER   [facility] FAST TRACK!!!  SAME DAY Appointments for ACUTE CARE  Such as: Sprains, Injuries, cuts, abrasions, rashes, muscle pain, joint pain, back pain Colds, flu,  sore throats, headache, allergies, cough, fever  Ear pain, sinus and eye infections Abdominal pain, nausea, vomiting, diarrhea, upset stomach Animal/insect bites  3 Easy Ways to Schedule: Walk-In Scheduling Call in scheduling Mychart Sign-up: [email or web address]                Colin Benton R., DO

## 2017-04-03 ENCOUNTER — Ambulatory Visit (INDEPENDENT_AMBULATORY_CARE_PROVIDER_SITE_OTHER): Payer: BLUE CROSS/BLUE SHIELD | Admitting: Family Medicine

## 2017-04-03 ENCOUNTER — Encounter: Payer: Self-pay | Admitting: Family Medicine

## 2017-04-03 VITALS — BP 136/90 | HR 97 | Temp 98.5°F | Ht 68.5 in | Wt 203.2 lb

## 2017-04-03 DIAGNOSIS — E876 Hypokalemia: Secondary | ICD-10-CM

## 2017-04-03 DIAGNOSIS — R600 Localized edema: Secondary | ICD-10-CM | POA: Diagnosis not present

## 2017-04-03 DIAGNOSIS — R221 Localized swelling, mass and lump, neck: Secondary | ICD-10-CM

## 2017-04-03 DIAGNOSIS — I10 Essential (primary) hypertension: Secondary | ICD-10-CM | POA: Diagnosis not present

## 2017-04-03 MED ORDER — LOSARTAN POTASSIUM 100 MG PO TABS: 100.0000 mg | ORAL_TABLET | Freq: Every day | ORAL | 3 refills | Status: DC

## 2017-04-03 NOTE — Patient Instructions (Addendum)
BEFORE YOU LEAVE: -Lab  -follow up: Follow-up 3-4 months  Please increase the losartan to 100 mg daily.  I sent a new dose of this to your pharmacy.  You may take 2 of the 50 mg tablets to use them up, but the new prescription will be for 100 mg tablets.  Continue the hydrochlorothiazide.  Call the ear nose and throat doctor to follow-up on the lump in the neck: [facility], [place] [address] Phone [phone]  We have ordered labs or studies at this visit. It can take up to 1-2 weeks for results and processing. IF results require follow up or explanation, we will call you with instructions. Clinically stable results will be released to your [facility]. If you have not heard from [place] or cannot find your results in [facility] in 2 weeks please contact our office at [phone].  If you are not yet signed up for [facility], please consider signing up.   We recommend the following healthy lifestyle for LIFE: 1) Small portions. But, make sure to get regular (at least 3 per day), healthy meals and small healthy snacks if needed.  2) Eat a healthy clean diet.   TRY TO EAT: -at least 5-7 servings of low sugar, colorful, and nutrient rich vegetables per day (not corn, potatoes or bananas.) -berries are the best choice if you wish to eat fruit (only eat small amounts if trying to reduce weight)  -lean meets (fish, white meat of chicken or [place]) -vegan proteins for some meals - beans or tofu, whole grains, nuts and seeds -Replace bad fats with good fats - good fats include: fish, nuts and seeds, canola oil, olive oil -small amounts of low fat or non fat dairy -small amounts of100 % whole grains - check the lables -drink plenty of water  AVOID: -SUGAR, sweets, anything with added sugar, corn syrup or sweeteners - must read labels as even foods advertised as "healthy" often are loaded with sugar -if you must have a sweetener, small amounts of stevia may be  best -sweetened beverages and artificially sweetened beverages -simple starches (rice, bread, potatoes, pasta, chips, etc - small amounts of 100% whole grains are ok) -red meat, pork, butter -fried foods, fast food, processed food, excessive dairy, eggs and coconut.  3)Get at least 150 minutes of sweaty aerobic exercise per week.  4)Reduce stress - consider counseling, meditation and relaxation to balance other aspects of your life.  WE NOW OFFER   [facility] FAST TRACK!!!  SAME DAY Appointments for ACUTE CARE  Such as: Sprains, Injuries, cuts, abrasions, rashes, muscle pain, joint pain, back pain Colds, flu, sore throats, headache, allergies, cough, fever  Ear pain, sinus and eye infections Abdominal pain, nausea, vomiting, diarrhea, upset stomach Animal/insect bites  3 Easy Ways to Schedule: Walk-In Scheduling Call in scheduling Mychart Sign-up: [email or web address]

## 2017-04-10 ENCOUNTER — Other Ambulatory Visit: Payer: BLUE CROSS/BLUE SHIELD

## 2017-04-10 LAB — BASIC METABOLIC PANEL
BUN: 11 mg/dL (ref 6–23)
CHLORIDE: 102 meq/L (ref 96–112)
CO2: 30 mEq/L (ref 19–32)
Calcium: 9.4 mg/dL (ref 8.4–10.5)
Creatinine, Ser: 0.66 mg/dL (ref 0.40–1.20)
GFR: 120.97 mL/min (ref >60.00)
GLUCOSE: 129 mg/dL — AB (ref 70–99)
POTASSIUM: 3.5 meq/L (ref 3.5–5.1)
Sodium: 139 mEq/L (ref 135–145)

## 2017-04-19 ENCOUNTER — Ambulatory Visit: Payer: BLUE CROSS/BLUE SHIELD | Admitting: Family Medicine

## 2017-07-05 ENCOUNTER — Other Ambulatory Visit: Payer: Self-pay | Admitting: Family Medicine

## 2017-07-10 ENCOUNTER — Telehealth: Payer: Self-pay | Admitting: Family Medicine

## 2017-07-10 NOTE — Telephone Encounter (Signed)
Copied from [place]. Topic: General - Other >> Jul 10, 2017  3:04 PM Sarah Humphrey wrote: Reason for CRM: Need to speak with someone to verify dosage on  losartan (COZAAR) 100 MG tablet

## 2017-07-10 NOTE — Telephone Encounter (Signed)
I called the pharmacy and informed Deanna per the office notes the pt should be taking Losartan 100mg  a day, not 150mg .  She stated she will call the pt and inform her of this.

## 2017-07-31 NOTE — Progress Notes (Signed)
HPI:  Using dictation device. Unfortunately this device frequently misinterprets words/phrases.  Sarah Humphrey is a pleasant 52 y.o. here for follow up. Chronic medical problems summarized below were reviewed for changes.  Reports doing well for the most part.  She has had some pain in the left side of her jaw.  She saw her dentist about this and was told this is TMJ.  This is starting to feel better.  Started about 1 week ago.  Almost resolved.  Denies CP, SOB, DOE, treatment intolerance or new symptoms. Due for labs, eye exam - declines vaccines  DM: -meds: none, diet controlled  HLD:  Follow-up hypertension/LE edema:  -meds: losartan , hydrochlorothiazide   A history of a swollen gland in her neck: -We referred her to ear nose and throat for this, she had reported a resolved -She has seen her dentist about this as well -no fevers, malaise or dysphagia  Chronic iron def anemai: -had colonoscopy 2018 -menstruating -takes iron   ROS: See pertinent positives and negatives per HPI.  Past Medical History:  Diagnosis Date  . ANEMIA, IRON DEFICIENCY 03/18/2010   Qualifier: Diagnosis of  By: Wynona Luna   . CHEST PAIN, ATYPICAL 04/05/2009   Qualifier: Diagnosis of  By: Wynona Luna /per pt did not have any chest pain  . Diabetes mellitus without complication ([place])    borderline/ no meds per pt.  . Hypertension   . Salivary gland obstruction    -s/p eval with ENT, Dr. Wilburn Cornelia in 2018 for submandibular swelling    Past Surgical History:  Procedure Laterality Date  . TUBAL LIGATION  2002    Family History  Problem Relation Age of Onset  . Hypertension Mother   . Hypertension Brother     SOCIAL HX: No significant changes reported to   Current Outpatient Medications:  .  losartan-hydrochlorothiazide (HYZAAR) 100-25 MG tablet, Take 1 tablet by mouth daily., Disp: 90 tablet, Rfl: 3  EXAM:  Vitals:   08/02/17 0739  BP: 120/90  Pulse: 91  Temp: 98.2  F (36.8 C)    Body mass index is 30.54 kg/m.  GENERAL: vitals reviewed and listed above, alert, oriented, appears well hydrated and in no acute distress  HEENT: atraumatic, conjunttiva clear, no obvious abnormalities on inspection of external nose and ears, minimal tenderness to palpation around the TMJ joint on the left, very mild deviation of the jaw to the left, no crepitus of the TMJ joint, no locking or catching  NECK: no obvious masses on inspection  LUNGS: clear to auscultation bilaterally, no wheezes, rales or rhonchi, good air movement  CV: HRRR, no peripheral edema  MS: moves all extremities without noticeable abnormality  PSYCH: pleasant and cooperative, no obvious depression or anxiety  ASSESSMENT AND PLAN:  Discussed the following assessment and plan:  Temporomandibular joint dysfunction  Diabetes mellitus without complication ([place])  Hypertension associated with diabetes ([place])  Hyperlipidemia associated with type 2 diabetes mellitus ([place])  BMI 30.0-30.9,adult  -Labs today -Opted to increase her blood pressure medication further, sent combination of this dose and discussed this change at length, advised her to check with her insurance about recalls -Lifestyle recommendations -Health maintenance reviewed with assistant -Home exercises for TMJ and advised to call us if any further issues in the next few weeks -She is seeing ear nose and throat in the past about swollen glands in her neck, these recurrent times, advised her to follow-up with ENT if recurrent symptoms, no significant  findings on exam today -Follow-up 3 months, sooner as needed  Patient Instructions  BEFORE YOU LEAVE: -TMJ handout -Review health maintenance -labs -follow up: 3-4 months  Increase the blood pressure medication.  I sent in a combination of the hydrochlorothiazide and losartan.  If you wish to use up the pills you have, take 2 tablets of the hydrochlorothiazide and 1 tablet of  the losartan daily.  When you get the new medicine, please take 1 tablet daily as it will contain both medications.  Follow-up with your ear nose and throat doctor if any further issues with the glands in the neck.  Do the exercises provided for the jaw issues and try to eat softer foods for the next 1 week.  Heat and gentle massage can also help.  Follow-up if this persists.  We have ordered labs or studies at this visit. It can take up to 1-2 weeks for results and processing. IF results require follow up or explanation, we will call you with instructions. Clinically stable results will be released to your [facility]. If you have not heard from [place] or cannot find your results in [facility] in 2 weeks please contact our office at [phone].  If you are not yet signed up for [facility], please consider signing up.   We recommend the following healthy lifestyle for LIFE: 1) Small portions. But, make sure to get regular (at least 3 per day), healthy meals and small healthy snacks if needed.  2) Eat a healthy clean diet.   TRY TO EAT: -at least 5-7 servings of low sugar, colorful, and nutrient rich vegetables per day (not corn, potatoes or bananas.) -berries are the best choice if you wish to eat fruit (only eat small amounts if trying to reduce weight)  -lean meets (fish, white meat of chicken or [place]) -vegan proteins for some meals - beans or tofu, whole grains, nuts and seeds -Replace bad fats with good fats - good fats include: fish, nuts and seeds, canola oil, olive oil -small amounts of low fat or non fat dairy -small amounts of100 % whole grains - check the lables -drink plenty of water  AVOID: -SUGAR, sweets, anything with added sugar, corn syrup or sweeteners - must read labels as even foods advertised as "healthy" often are loaded with sugar -if you must have a sweetener, small amounts of stevia may be best -sweetened beverages and artificially sweetened beverages -simple starches  (rice, bread, potatoes, pasta, chips, etc - small amounts of 100% whole grains are ok) -red meat, pork, butter -fried foods, fast food, processed food, excessive dairy, eggs and coconut.  3)Get at least 150 minutes of sweaty aerobic exercise per week.  4)Reduce stress - consider counseling, meditation and relaxation to balance other aspects of your life.          Lucretia Kern, DO

## 2017-08-02 ENCOUNTER — Ambulatory Visit (INDEPENDENT_AMBULATORY_CARE_PROVIDER_SITE_OTHER): Payer: BLUE CROSS/BLUE SHIELD | Admitting: Family Medicine

## 2017-08-02 ENCOUNTER — Encounter: Payer: Self-pay | Admitting: Family Medicine

## 2017-08-02 VITALS — BP 120/90 | HR 91 | Temp 98.2°F | Ht 68.5 in | Wt 203.8 lb

## 2017-08-02 DIAGNOSIS — I152 Hypertension secondary to endocrine disorders: Secondary | ICD-10-CM

## 2017-08-02 DIAGNOSIS — E1159 Type 2 diabetes mellitus with other circulatory complications: Secondary | ICD-10-CM

## 2017-08-02 DIAGNOSIS — I10 Essential (primary) hypertension: Secondary | ICD-10-CM

## 2017-08-02 DIAGNOSIS — E119 Type 2 diabetes mellitus without complications: Secondary | ICD-10-CM

## 2017-08-02 DIAGNOSIS — Z6830 Body mass index (BMI) 30.0-30.9, adult: Secondary | ICD-10-CM | POA: Diagnosis not present

## 2017-08-02 DIAGNOSIS — E785 Hyperlipidemia, unspecified: Secondary | ICD-10-CM

## 2017-08-02 DIAGNOSIS — E1169 Type 2 diabetes mellitus with other specified complication: Secondary | ICD-10-CM | POA: Diagnosis not present

## 2017-08-02 DIAGNOSIS — M26609 Unspecified temporomandibular joint disorder, unspecified side: Secondary | ICD-10-CM

## 2017-08-02 MED ORDER — LOSARTAN POTASSIUM-HCTZ 100-25 MG PO TABS: 1.0000 | ORAL_TABLET | Freq: Every day | ORAL | 3 refills | Status: DC

## 2017-08-02 NOTE — Patient Instructions (Addendum)
BEFORE YOU LEAVE: -TMJ handout -Review health maintenance -labs -follow up: 3-4 months  Increase the blood pressure medication.  I sent in a combination of the hydrochlorothiazide and losartan.  If you wish to use up the pills you have, take 2 tablets of the hydrochlorothiazide and 1 tablet of the losartan daily.  When you get the new medicine, please take 1 tablet daily as it will contain both medications.  Follow-up with your ear nose and throat doctor if any further issues with the glands in the neck.  Do the exercises provided for the jaw issues and try to eat softer foods for the next 1 week.  Heat and gentle massage can also help.  Follow-up if this persists.  We have ordered labs or studies at this visit. It can take up to 1-2 weeks for results and processing. IF results require follow up or explanation, we will call you with instructions. Clinically stable results will be released to your [facility]. If you have not heard from [place] or cannot find your results in [facility] in 2 weeks please contact our office at [phone].  If you are not yet signed up for [facility], please consider signing up.   We recommend the following healthy lifestyle for LIFE: 1) Small portions. But, make sure to get regular (at least 3 per day), healthy meals and small healthy snacks if needed.  2) Eat a healthy clean diet.   TRY TO EAT: -at least 5-7 servings of low sugar, colorful, and nutrient rich vegetables per day (not corn, potatoes or bananas.) -berries are the best choice if you wish to eat fruit (only eat small amounts if trying to reduce weight)  -lean meets (fish, white meat of chicken or [place]) -vegan proteins for some meals - beans or tofu, whole grains, nuts and seeds -Replace bad fats with good fats - good fats include: fish, nuts and seeds, canola oil, olive oil -small amounts of low fat or non fat dairy -small amounts of100 % whole grains - check the lables -drink plenty of  water  AVOID: -SUGAR, sweets, anything with added sugar, corn syrup or sweeteners - must read labels as even foods advertised as "healthy" often are loaded with sugar -if you must have a sweetener, small amounts of stevia may be best -sweetened beverages and artificially sweetened beverages -simple starches (rice, bread, potatoes, pasta, chips, etc - small amounts of 100% whole grains are ok) -red meat, pork, butter -fried foods, fast food, processed food, excessive dairy, eggs and coconut.  3)Get at least 150 minutes of sweaty aerobic exercise per week.  4)Reduce stress - consider counseling, meditation and relaxation to balance other aspects of your life.

## 2017-08-14 ENCOUNTER — Telehealth: Payer: Self-pay | Admitting: *Deleted

## 2017-08-14 NOTE — Telephone Encounter (Signed)
Dr Maudie Mercury received a fax from [facility] [phone]) regarding the pts visit on 3/20.  Dr Maudie Mercury reviewed this and stated to call the pt to see if she would like for [place] to place a referral to a maxillofacial doctor and if so the referral can be placed OR the dentist could refer her for this or PT if they feel this is appropriate.  I left a message for the pt to return my call with her preference.

## 2017-10-12 ENCOUNTER — Telehealth: Payer: Self-pay | Admitting: Family Medicine

## 2017-10-12 NOTE — Telephone Encounter (Signed)
Copied from [place] [id number]. Topic: Inquiry >> Oct 12, 2017  2:44 PM Scherrie Gerlach wrote: Reason for CRM: pt is requesting a note that informs her employer that she needs frequent bathroom privileges due to the meds she is on. Mail to her home please

## 2017-10-16 ENCOUNTER — Encounter: Payer: Self-pay | Admitting: *Deleted

## 2017-10-16 NOTE — Telephone Encounter (Signed)
I called the pt and left a detailed message to inform her the letter was completed and left at the front desk for pick up.

## 2017-10-16 NOTE — Telephone Encounter (Signed)
Ok to write note that state that she is taking a medication that can cause increased urinary frequency.

## 2017-12-03 NOTE — Progress Notes (Deleted)
  HPI:  Using dictation device. Unfortunately this device frequently misinterprets words/phrases.  Sarah Humphrey is a pleasant 52 y.o. here for follow up. Chronic medical problems summarized below were reviewed for changes and stability and were updated as needed below. These issues and their treatment remain stable for the most part. ***. Denies CP, SOB, DOE, treatment intolerance or new symptoms.  HTN/Hx LE edema: -meds:losartan, hctz -  Obesity/Hx Hyperglycemia and HLD: -diabetes resolved with lifestyle changes in 2015 -wt 203 (7/10)  ROS: See pertinent positives and negatives per HPI.  Past Medical History:  Diagnosis Date  . ANEMIA, IRON DEFICIENCY 03/18/2010   Qualifier: Diagnosis of  By: Wynona Luna   . CHEST PAIN, ATYPICAL 04/05/2009   Qualifier: Diagnosis of  By: Wynona Luna /per pt did not have any chest pain  . Diabetes mellitus without complication ([place])    borderline/ no meds per pt.  . Hypertension   . Salivary gland obstruction    -s/p eval with ENT, Dr. Wilburn Cornelia in 2018 for submandibular swelling    Past Surgical History:  Procedure Laterality Date  . TUBAL LIGATION  2002    Family History  Problem Relation Age of Onset  . Hypertension Mother   . Hypertension Brother     SOCIAL HX: ***   Current Outpatient Medications:  .  losartan-hydrochlorothiazide (HYZAAR) 100-25 MG tablet, Take 1 tablet by mouth daily., Disp: 90 tablet, Rfl: 3  EXAM:  There were no vitals filed for this visit.  There is no height or weight on file to calculate BMI.  GENERAL: vitals reviewed and listed above, alert, oriented, appears well hydrated and in no acute distress  HEENT: atraumatic, conjunttiva clear, no obvious abnormalities on inspection of external nose and ears  NECK: no obvious masses on inspection  LUNGS: clear to auscultation bilaterally, no wheezes, rales or rhonchi, good air movement  CV: HRRR, no peripheral edema  MS: moves all  extremities without noticeable abnormality *** PSYCH: pleasant and cooperative, no obvious depression or anxiety  ASSESSMENT AND PLAN:  Discussed the following assessment and plan:  No diagnosis found.  *** -Patient advised to return or notify a doctor immediately if symptoms worsen or persist or new concerns arise.  There are no Patient Instructions on file for this visit.  Lucretia Kern, DO

## 2017-12-06 ENCOUNTER — Ambulatory Visit: Payer: BLUE CROSS/BLUE SHIELD | Admitting: Family Medicine

## 2018-04-02 ENCOUNTER — Other Ambulatory Visit: Payer: Self-pay | Admitting: Family Medicine

## 2018-04-02 DIAGNOSIS — Z1231 Encounter for screening mammogram for malignant neoplasm of breast: Secondary | ICD-10-CM

## 2018-05-16 ENCOUNTER — Inpatient Hospital Stay: Admission: RE | Admit: 2018-05-16 | Payer: BLUE CROSS/BLUE SHIELD | Source: Ambulatory Visit

## 2018-06-18 ENCOUNTER — Encounter: Payer: Self-pay | Admitting: Radiology

## 2018-06-18 ENCOUNTER — Ambulatory Visit
Admission: RE | Admit: 2018-06-18 | Discharge: 2018-06-18 | Disposition: A | Discharge status: Home / Self Care | Payer: BLUE CROSS/BLUE SHIELD | Source: Ambulatory Visit | Attending: Family Medicine | Admitting: Family Medicine

## 2018-06-18 DIAGNOSIS — Z1231 Encounter for screening mammogram for malignant neoplasm of breast: Secondary | ICD-10-CM

## 2018-07-02 DIAGNOSIS — Z0000 Encounter for general adult medical examination without abnormal findings: Secondary | ICD-10-CM | POA: Diagnosis not present

## 2018-10-05 ENCOUNTER — Other Ambulatory Visit: Payer: Self-pay | Admitting: Family Medicine

## 2018-10-16 ENCOUNTER — Other Ambulatory Visit: Payer: Self-pay

## 2018-10-16 ENCOUNTER — Encounter: Payer: Self-pay | Admitting: Family Medicine

## 2018-10-16 ENCOUNTER — Ambulatory Visit (INDEPENDENT_AMBULATORY_CARE_PROVIDER_SITE_OTHER): Payer: BC Managed Care – PPO | Admitting: Family Medicine

## 2018-10-16 ENCOUNTER — Encounter: Payer: BLUE CROSS/BLUE SHIELD | Admitting: Family Medicine

## 2018-10-16 DIAGNOSIS — N920 Excessive and frequent menstruation with regular cycle: Secondary | ICD-10-CM | POA: Diagnosis not present

## 2018-10-16 DIAGNOSIS — Z13220 Encounter for screening for lipoid disorders: Secondary | ICD-10-CM

## 2018-10-16 DIAGNOSIS — R739 Hyperglycemia, unspecified: Secondary | ICD-10-CM

## 2018-10-16 DIAGNOSIS — I10 Essential (primary) hypertension: Secondary | ICD-10-CM | POA: Diagnosis not present

## 2018-10-16 MED ORDER — LOSARTAN POTASSIUM-HCTZ 100-25 MG PO TABS: 1.0000 | ORAL_TABLET | Freq: Every day | ORAL | 1 refills | Status: DC

## 2018-10-16 NOTE — Progress Notes (Signed)
Virtual Visit via Video Note  I connected with Sarah Humphrey   on 10/17/18 at  2:30 PM EDT by a video enabled telemedicine application and verified that I am speaking with the correct person using two identifiers.  Location patient: home Location provider:work office Persons participating in the virtual visit: patient, provider  I discussed the limitations of evaluation and management by telemedicine and the availability of in person appointments. The patient expressed understanding and agreed to proceed.   Sarah Humphrey DOB: February 24, 1966 Encounter date: 10/16/2018  This is a 53 y.o. female who presents to establish care. Chief Complaint  Patient presents with  . Establish Care    History of present illness:  Feels like she is getting some swelling in neck/just below chin. Has happened for last couple of years. Has seen specialist; but nothing was found. Spicy foods can trigger this. Sometimes when she is brushing teeth feels that "something opens" and then notes draining. Had noticed with sweets as well. Doesn't always bother her - can go months without symptoms.    High blood sugar: does not check sugars at home.   UUV:OZDGUY losartan-hctz 100-25 daily. Trying to walk in neighborhood daily. She does not check blood pressures at home.   Taking iron pill on daily basis to help with iron deficiency hx. She is still having periods. Monthly. Menstruates for 7 days, but flow is slower after first 3 days. Changes pads/tampons every 1-2 hours. Will leak if she does not change. Sometimes has blood clots.    Past Medical History:  Diagnosis Date  . ANEMIA, IRON DEFICIENCY 03/18/2010   Qualifier: Diagnosis of  By: Wynona Luna   . CHEST PAIN, ATYPICAL 04/05/2009   Qualifier: Diagnosis of  By: Wynona Luna /per pt did not have any chest pain  . Diabetes mellitus without complication ([place])    borderline/ no meds per pt.  . Hypertension   . Salivary gland obstruction    -s/p  eval with ENT, Dr. Wilburn Cornelia in 2018 for submandibular swelling   Past Surgical History:  Procedure Laterality Date  . TUBAL LIGATION  2002   No Known Allergies Current Meds  Medication Sig  . losartan-hydrochlorothiazide (HYZAAR) 100-25 MG tablet Take 1 tablet by mouth daily.  . [DISCONTINUED] losartan-hydrochlorothiazide (HYZAAR) 100-25 MG tablet Take 1 tablet by mouth once daily   Social History   Tobacco Use  . Smoking status: Never Smoker  . Smokeless tobacco: Never Used  Substance Use Topics  . Alcohol use: No   Family History  Problem Relation Age of Onset  . Hypertension Mother   . Other Father        unknowon  . Hypertension Brother   . Breast cancer Neg Hx      Review of Systems  Constitutional: Negative for chills, fatigue and fever.  Respiratory: Negative for cough, chest tightness, shortness of breath and wheezing.   Cardiovascular: Negative for chest pain, palpitations and leg swelling.    Objective:  There were no vitals taken for this visit.      BP Readings from Last 3 Encounters:  08/02/17 120/90  04/03/17 136/90  03/05/17 130/90   Wt Readings from Last 3 Encounters:  08/02/17 203 lb 12.8 oz (92.4 kg)  04/03/17 203 lb 3.2 oz (92.2 kg)  03/05/17 200 lb 6.4 oz (90.9 kg)    EXAM:  GENERAL: alert, oriented, appears well and in no acute distress  HEENT: atraumatic, conjunctiva clear, no obvious abnormalities on  inspection of external nose and ears  NECK: normal movements of the head and neck  LUNGS: on inspection no signs of respiratory distress, breathing rate appears normal, no obvious gross SOB, gasping or wheezing  CV: no obvious cyanosis  MS: moves all visible extremities without noticeable abnormality  PSYCH/NEURO: pleasant and cooperative, no obvious depression or anxiety, speech and thought processing grossly intact  SKIN: no noted facial/neck abnormalities  Assessment/Plan  1. Hypertension, unspecified type Continue current  medication; will plan to recheck in office in next few months. - losartan-hydrochlorothiazide (HYZAAR) 100-25 MG tablet; Take 1 tablet by mouth daily.  Dispense: 90 tablet; Refill: 1 - CBC with Differential/Platelet; Future - Comprehensive metabolic panel; Future  2. Hyperglycemia - Hemoglobin A1c; Future - Microalbumin / creatinine urine ratio; Future  3. Menorrhagia with regular cycle - TSH; Future  4. Lipid screening - Lipid panel; Future   Return bloodwork; then physical.Will be due for pap.   I discussed the assessment and treatment plan with the patient. The patient was provided an opportunity to ask questions and all were answered. The patient agreed with the plan and demonstrated an understanding of the instructions.   The patient was advised to call back or seek an in-person evaluation if the symptoms worsen or if the condition fails to improve as anticipated.  I provided 25 minutes of non-face-to-face time during this encounter.   Micheline Rough, MD

## 2018-10-17 ENCOUNTER — Telehealth: Payer: Self-pay | Admitting: *Deleted

## 2018-10-17 NOTE — Telephone Encounter (Signed)
-----   Message from Caren Macadam, MD sent at 10/16/2018  2:53 PM EDT ----- Please schedule for bloodwork; she is due for physical so ok to schedule that as well if she would like (otherwise can wait until bloodwork results)

## 2018-10-17 NOTE — Telephone Encounter (Signed)
I left a message for the pt to return my call. 

## 2018-10-24 NOTE — Telephone Encounter (Signed)
I left a detailed message for the pt to call back and schedule an appt as below.

## 2019-01-23 NOTE — Progress Notes (Addendum)
Sarah Humphrey DOB: 1965/09/20 Encounter date: 01/24/2019  This is a 53 y.o. female who presents for complete physical   History of present illness/Additional concerns: Last visit was 10/2018 to establish care.   Feels like there is some puffiness at base of neck near clavicles.   Complained of salivary gland issue/enlargement: still getting this off and on. Sometimes feels that mouth is dry. That has been occurring for more than a couple of years. ENT did evaluation but couldn't find anything. Can feel it open up sometimes. Spicy foods seem to make it close up.   HTN: losartan-hctz: Took bp medication this morning about 6:30. Doesn't measure at home so not sure what she normally runs.   Iron def: does take daily supplement 65 elemental.   Walking 5 days per week. Eating healthy.   LMP: gets symptoms of period and some spotting. Has been happening this way for last few months. Also does get hot flashes. Turns on fan in middle of night.    Past Medical History:  Diagnosis Date   ANEMIA, IRON DEFICIENCY 03/18/2010   Qualifier: Diagnosis of  By: Wynona Luna    CHEST PAIN, ATYPICAL 04/05/2009   Qualifier: Diagnosis of  By: Wynona Luna /per pt did not have any chest pain   Diabetes mellitus without complication ([place])    borderline/ no meds per pt.   Hypertension    Salivary gland obstruction    -s/p eval with ENT, Dr. Wilburn Cornelia in 2018 for submandibular swelling   Past Surgical History:  Procedure Laterality Date   TUBAL LIGATION  2002   No Known Allergies Current Meds  Medication Sig   losartan-hydrochlorothiazide (HYZAAR) 100-25 MG tablet Take 1 tablet by mouth daily.   Social History   Tobacco Use   Smoking status: Never Smoker   Smokeless tobacco: Never Used  Substance Use Topics   Alcohol use: No   Family History  Problem Relation Age of Onset   Hypertension Mother    Other Father        unknowon   Hypertension Brother    Breast cancer  Neg Hx      Review of Systems  Constitutional: Negative for activity change, appetite change, chills, fatigue, fever and unexpected weight change.  HENT: Negative for congestion, ear pain, hearing loss, sinus pressure, sinus pain, sore throat and trouble swallowing.   Eyes: Negative for pain and visual disturbance.  Respiratory: Negative for cough, chest tightness, shortness of breath and wheezing.   Cardiovascular: Negative for chest pain, palpitations and leg swelling.  Gastrointestinal: Negative for abdominal pain, blood in stool, constipation, diarrhea, nausea and vomiting.  Genitourinary: Negative for difficulty urinating and menstrual problem.  Musculoskeletal: Positive for arthralgias (just thumb joints; had trigger finger issue right hand and then moved to left hand). Negative for back pain.  Skin: Negative for rash.  Neurological: Negative for dizziness, weakness, numbness and headaches.  Hematological: Negative for adenopathy. Does not bruise/bleed easily.  Psychiatric/Behavioral: Negative for sleep disturbance and suicidal ideas. The patient is not nervous/anxious.     CBC:  Lab Results  Component Value Date   WBC 6.2 03/05/2017   HGB 11.7 (L) 03/05/2017   HCT 36.4 03/05/2017   MCHC 32.2 03/05/2017   RDW 14.9 03/05/2017   PLT 275.0 03/05/2017   CMP: Lab Results  Component Value Date   NA 139 04/10/2017   K 3.5 04/10/2017   CL 102 04/10/2017   CO2 30 04/10/2017   GLUCOSE 129 (  H) 04/10/2017   BUN 11 04/10/2017   CREATININE 0.66 04/10/2017   CALCIUM 9.4 04/10/2017   PROT 7.0 03/15/2010   BILITOT 0.6 03/15/2010   ALKPHOS 36 (L) 03/15/2010   ALT 12 03/15/2010   AST 20 03/15/2010   LIPID: Lab Results  Component Value Date   CHOL 146 12/22/2016   TRIG 59.0 12/22/2016   HDL 41.40 12/22/2016   LDLCALC 93 12/22/2016    Objective:  BP 132/90 (BP Location: Right Arm, Cuff Size: Large)    Pulse 78    Temp 98.3 F (36.8 C) (Temporal)    Ht 5\' 8"  (1.727 m)    Wt  207 lb 12.8 oz (94.3 kg)    SpO2 98%    BMI 31.60 kg/m   Weight: 207 lb 12.8 oz (94.3 kg)   BP Readings from Last 3 Encounters:  01/24/19 132/90  08/02/17 120/90  04/03/17 136/90   Wt Readings from Last 3 Encounters:  01/24/19 207 lb 12.8 oz (94.3 kg)  08/02/17 203 lb 12.8 oz (92.4 kg)  04/03/17 203 lb 3.2 oz (92.2 kg)    Physical Exam Exam conducted with a chaperone present.  Constitutional:      General: She is not in acute distress.    Appearance: She is well-developed.  HENT:     Head: Normocephalic and atraumatic.     Salivary Glands: Right salivary gland is diffusely enlarged. Right salivary gland is not tender. Left salivary gland is diffusely enlarged. Left salivary gland is not tender.     Right Ear: Tympanic membrane and external ear normal.     Left Ear: Tympanic membrane and external ear normal.     Mouth/Throat:     Mouth: Mucous membranes are dry.     Dentition: Normal dentition. No dental tenderness.     Tongue: No lesions.     Palate: No mass and lesions.     Pharynx: Oropharynx is clear. No oropharyngeal exudate.     Tonsils: No tonsillar exudate.  Eyes:     Conjunctiva/sclera: Conjunctivae normal.     Pupils: Pupils are equal, round, and reactive to light.  Neck:     Musculoskeletal: Normal range of motion and neck supple.     Thyroid: No thyroid mass or thyromegaly.      Comments: Puffiness in supraclavicular area, without surrounding palpable lymphadenopathy.  Cardiovascular:     Rate and Rhythm: Normal rate and regular rhythm.     Heart sounds: Normal heart sounds. No murmur. No friction rub. No gallop.   Pulmonary:     Effort: Pulmonary effort is normal.     Breath sounds: Normal breath sounds.  Abdominal:     General: Bowel sounds are normal. There is no distension.     Palpations: Abdomen is soft. There is no mass.     Tenderness: There is no abdominal tenderness. There is no guarding.     Hernia: A hernia is present. Hernia is present in the  umbilical area (small 0000000, reducible). There is no hernia in the left inguinal area or right inguinal area.  Genitourinary:    Exam position: Supine.     Labia:        Right: No rash, tenderness or lesion.        Left: No rash, tenderness or lesion.      Urethra: No prolapse, urethral pain or urethral swelling.     Vagina: Normal.     Cervix: Cervical bleeding present.     Uterus: Normal.  Adnexa: Right adnexa normal and left adnexa normal.     Rectum: Guaiac result negative. External hemorrhoid present. No mass, tenderness, anal fissure or internal hemorrhoid. Normal anal tone.  Musculoskeletal: Normal range of motion.        General: No tenderness or deformity.  Lymphadenopathy:     Head:     Right side of head: Submandibular adenopathy present. No submental adenopathy.     Left side of head: Submandibular adenopathy present. No submental adenopathy.     Cervical: No cervical adenopathy.     Right cervical: No superficial, deep or posterior cervical adenopathy.    Left cervical: No superficial, deep or posterior cervical adenopathy.     Lower Body: No right inguinal adenopathy. No left inguinal adenopathy.     Comments: Submandibular gland enlargement bilat.   Skin:    General: Skin is warm and dry.     Findings: No rash.     Comments: Multiple skin moles, small, unchanged per patient  Neurological:     Mental Status: She is alert and oriented to person, place, and time.     Deep Tendon Reflexes: Reflexes normal.     Reflex Scores:      Tricep reflexes are 2+ on the right side and 2+ on the left side.      Bicep reflexes are 2+ on the right side and 2+ on the left side.      Brachioradialis reflexes are 2+ on the right side and 2+ on the left side.      Patellar reflexes are 2+ on the right side and 2+ on the left side. Psychiatric:        Speech: Speech normal.        Behavior: Behavior normal.        Thought Content: Thought content normal.      Assessment/Plan: Health Maintenance Due  Topic Date Due   HEMOGLOBIN A1C  06/24/2017   FOOT EXAM  12/22/2017   PAP SMEAR-Modifier  09/01/2018   TETANUS/TDAP  01/15/2019   Health Maintenance reviewed. 1. Preventative health care Up to date with mammogram, colonoscopy.   2. Type 2 diabetes mellitus without complication, without long-term current use of insulin ([facility]) - Hemoglobin A1c; Future - Microalbumin / creatinine urine ratio; Future - HM DIABETES FOOT EXAM; Future - Hemoglobin A1c - Microalbumin / creatinine urine ratio  3. Hypertension, unspecified type Slightly elevated in office today. Will have her check at home and can med adjust pending home readings.  - Comprehensive metabolic panel; Future - Comprehensive metabolic panel  4. Hyperlipidemia associated with type 2 diabetes mellitus ([place]) - Comprehensive metabolic panel; Future - Lipid panel; Future - Lipid panel - Comprehensive metabolic panel  5. Enlarged glands Start with bloodwork. Has seen dentist and ENT for enlarged submandibular glands. Consider further eval pending results.  6. Dry mouth - TSH; Future - ANA; Future - Sedimentation rate; Future - Sjogrens syndrome-A extractable nuclear antibody; Future - Sjogrens syndrome-A extractable nuclear antibody - Sedimentation rate - ANA - TSH  7. Arthralgia, unspecified joint Mild, encouraged to let me know if trigger finger worsening. Declines referral at this time. Doesn't want surgery or injections.  8. Anemia, unspecified type Taking iron supplement daily.  - CBC with Differential/Platelet; Future - Vitamin B12; Future - IBC + Ferritin; Future - IBC + Ferritin - Vitamin B12 - CBC with Differential/Platelet  9. Cervical cancer screening Completed today. - PAP [[place]]  Return for pending lab results.  Micheline Rough, MD

## 2019-01-24 ENCOUNTER — Other Ambulatory Visit (HOSPITAL_COMMUNITY)
Admission: RE | Admit: 2019-01-24 | Discharge: 2019-01-24 | Disposition: A | Discharge status: Home / Self Care | Payer: BC Managed Care – PPO | Source: Ambulatory Visit | Attending: Family Medicine | Admitting: Family Medicine

## 2019-01-24 ENCOUNTER — Encounter: Payer: Self-pay | Admitting: Family Medicine

## 2019-01-24 ENCOUNTER — Ambulatory Visit (INDEPENDENT_AMBULATORY_CARE_PROVIDER_SITE_OTHER): Payer: BC Managed Care – PPO | Admitting: Family Medicine

## 2019-01-24 ENCOUNTER — Other Ambulatory Visit: Payer: Self-pay

## 2019-01-24 VITALS — BP 132/90 | HR 78 | Temp 98.3°F | Ht 68.0 in | Wt 207.8 lb

## 2019-01-24 DIAGNOSIS — Z0000 Encounter for general adult medical examination without abnormal findings: Secondary | ICD-10-CM | POA: Diagnosis not present

## 2019-01-24 DIAGNOSIS — R682 Dry mouth, unspecified: Secondary | ICD-10-CM

## 2019-01-24 DIAGNOSIS — D649 Anemia, unspecified: Secondary | ICD-10-CM | POA: Diagnosis not present

## 2019-01-24 DIAGNOSIS — I10 Essential (primary) hypertension: Secondary | ICD-10-CM

## 2019-01-24 DIAGNOSIS — E785 Hyperlipidemia, unspecified: Secondary | ICD-10-CM | POA: Diagnosis not present

## 2019-01-24 DIAGNOSIS — Z124 Encounter for screening for malignant neoplasm of cervix: Secondary | ICD-10-CM

## 2019-01-24 DIAGNOSIS — E119 Type 2 diabetes mellitus without complications: Secondary | ICD-10-CM

## 2019-01-24 DIAGNOSIS — M2550 Pain in unspecified joint: Secondary | ICD-10-CM

## 2019-01-24 DIAGNOSIS — E1169 Type 2 diabetes mellitus with other specified complication: Secondary | ICD-10-CM

## 2019-01-24 DIAGNOSIS — R599 Enlarged lymph nodes, unspecified: Secondary | ICD-10-CM

## 2019-01-24 LAB — MICROALBUMIN / CREATININE URINE RATIO
Creatinine,U: 69.6 mg/dL
Microalb Creat Ratio: 19.8 mg/g (ref 0.0–30.0)
Microalb, Ur: 13.8 mg/dL — ABNORMAL HIGH (ref 0.0–1.9)

## 2019-01-24 LAB — SEDIMENTATION RATE: Sed Rate: 28 mm/hr (ref 0–30)

## 2019-01-24 LAB — COMPREHENSIVE METABOLIC PANEL
ALT: 12 U/L (ref 0–35)
AST: 13 U/L (ref 0–37)
Albumin: 3.9 g/dL (ref 3.5–5.2)
Alkaline Phosphatase: 49 U/L (ref 39–117)
BUN: 9 mg/dL (ref 6–23)
CO2: 32 mEq/L (ref 19–32)
Calcium: 9.3 mg/dL (ref 8.4–10.5)
Chloride: 101 mEq/L (ref 96–112)
Creatinine, Ser: 0.69 mg/dL (ref 0.40–1.20)
GFR: 107.39 mL/min (ref >60.00)
Glucose, Bld: 124 mg/dL — ABNORMAL HIGH (ref 70–99)
Potassium: 3.2 mEq/L — ABNORMAL LOW (ref 3.5–5.1)
Sodium: 140 mEq/L (ref 135–145)
Total Bilirubin: 0.6 mg/dL (ref 0.2–1.2)
Total Protein: 6.9 g/dL (ref 6.0–8.3)

## 2019-01-24 LAB — CBC WITH DIFFERENTIAL/PLATELET
Basophils Absolute: 0 10*3/uL (ref 0.0–0.1)
Basophils Relative: 0.5 % (ref 0.0–3.0)
Eosinophils Absolute: 0.1 10*3/uL (ref 0.0–0.7)
Eosinophils Relative: 2.1 % (ref 0.0–5.0)
HCT: 37.6 % (ref 36.0–46.0)
Hemoglobin: 12.3 g/dL (ref 12.0–15.0)
Lymphocytes Relative: 31.2 % (ref 12.0–46.0)
Lymphs Abs: 1.6 10*3/uL (ref 0.7–4.0)
MCHC: 32.6 g/dL (ref 30.0–36.0)
MCV: 81.9 fl (ref 78.0–100.0)
Monocytes Absolute: 0.4 10*3/uL (ref 0.1–1.0)
Monocytes Relative: 7.4 % (ref 3.0–12.0)
Neutro Abs: 3.1 10*3/uL (ref 1.4–7.7)
Neutrophils Relative %: 58.8 % (ref 43.0–77.0)
Platelets: 210 10*3/uL (ref 150.0–400.0)
RBC: 4.6 Mil/uL (ref 3.87–5.11)
RDW: 16.7 % — ABNORMAL HIGH (ref 11.5–15.5)
WBC: 5.2 10*3/uL (ref 4.0–10.5)

## 2019-01-24 LAB — IBC + FERRITIN
Ferritin: 45.5 ng/mL (ref 10.0–291.0)
Iron: 84 ug/dL (ref 42–145)
Saturation Ratios: 23.7 % (ref 20.0–50.0)
Transferrin: 253 mg/dL (ref 212.0–360.0)

## 2019-01-24 LAB — HEMOGLOBIN A1C: Hgb A1c MFr Bld: 6.9 % — ABNORMAL HIGH (ref 4.6–6.5)

## 2019-01-24 LAB — LIPID PANEL
Cholesterol: 142 mg/dL (ref 0–200)
HDL: 37.6 mg/dL — ABNORMAL LOW (ref >39.00)
LDL Cholesterol: 80 mg/dL (ref 0–99)
NonHDL: 104.58
Total CHOL/HDL Ratio: 4
Triglycerides: 125 mg/dL (ref 0.0–149.0)
VLDL: 25 mg/dL (ref 0.0–40.0)

## 2019-01-24 LAB — VITAMIN B12: Vitamin B-12: 847 pg/mL (ref 211–911)

## 2019-01-24 LAB — TSH: TSH: 2.37 u[IU]/mL (ref 0.35–4.50)

## 2019-01-25 LAB — CYTOLOGY - PAP
Diagnosis: NEGATIVE
HPV: NOT DETECTED

## 2019-01-27 ENCOUNTER — Telehealth: Payer: Self-pay

## 2019-01-27 ENCOUNTER — Other Ambulatory Visit: Payer: Self-pay | Admitting: Family Medicine

## 2019-01-27 LAB — SJOGRENS SYNDROME-A EXTRACTABLE NUCLEAR ANTIBODY: SSA (Ro) (ENA) Antibody, IgG: <1 AI

## 2019-01-27 LAB — ANA: Anti Nuclear Antibody (ANA): NEGATIVE

## 2019-01-27 MED ORDER — POTASSIUM CHLORIDE CRYS ER 10 MEQ PO TBCR: 10.0000 meq | EXTENDED_RELEASE_TABLET | ORAL | 1 refills | Status: DC

## 2019-01-27 NOTE — Telephone Encounter (Signed)
Left a message for the pt to return my call.  

## 2019-01-27 NOTE — Telephone Encounter (Signed)
Copied from [place] [id number]. Topic: General - Other >> Jan 27, 2019  2:34 PM Sarah Humphrey wrote: Reason for CRM:  pt called to say that her BP was 150/97 and to let Arville Go know to see what other meds she can take

## 2019-01-27 NOTE — Telephone Encounter (Signed)
She was a little elevated in office, but not that high. She is at max dose of the losartan component of current med, so we actually would need to add something else on board to help lower blood pressure. I would suggest amlodipine 2.5mg  daily and continuing current medication. The biggest side effect of amlodipine is swelling in legs at higher doses. Usually not issue with this low dose. Usually goes well with other meds. And she is already taking hctz which is fluid pill so should help compensate.   I would like to have visit set up in 1 mo to touch base and see how pressures are running. She can let me know sooner if any issues with meds. Takes 2 weeks to fully kick in but she should note some improvement right away. She can take this at bedtime if desired or at same time as other meds.   Send in 30 tab w 2 refills please.

## 2019-01-28 NOTE — Telephone Encounter (Signed)
Called the patient for the second time today as earlier she placed me on hold, I hung up and called her back again, she was working and asked that I call back at 4:30pm.  Patient stated she is at work and will call back tomorrow.  CRM created so this can be discussed with the nurse from the [facility].

## 2019-01-29 ENCOUNTER — Telehealth: Payer: Self-pay | Admitting: *Deleted

## 2019-01-29 NOTE — Progress Notes (Signed)
Looks like she is scheduled in a couple of weeks. We will review at that time.

## 2019-01-29 NOTE — Telephone Encounter (Signed)
See result note dated 01/29/2019; scheduled pt for appt with Dr Ethlyn Gallery on 02/28/2019 incorrectly; appointment cancelled; message left on pt's voicemail to make her aware; see below appointment note  follow up per Dr Ethlyn Gallery; pt ok with Doxy; pt can be contacted at [phone], and a message can be left.   Made On: 01/29/2019 8:00 AM By: Addison Naegeli   Notified LaWana notified, and she will contact pt.

## 2019-01-30 NOTE — Telephone Encounter (Signed)
Pt was called on 01/29/19 and was scheduled for her follow-up.

## 2019-02-12 DIAGNOSIS — L821 Other seborrheic keratosis: Secondary | ICD-10-CM | POA: Diagnosis not present

## 2019-02-12 DIAGNOSIS — L700 Acne vulgaris: Secondary | ICD-10-CM | POA: Diagnosis not present

## 2019-02-12 DIAGNOSIS — L658 Other specified nonscarring hair loss: Secondary | ICD-10-CM | POA: Diagnosis not present

## 2019-02-14 ENCOUNTER — Telehealth (INDEPENDENT_AMBULATORY_CARE_PROVIDER_SITE_OTHER): Payer: BC Managed Care – PPO | Admitting: Family Medicine

## 2019-02-14 ENCOUNTER — Other Ambulatory Visit: Payer: Self-pay

## 2019-02-14 ENCOUNTER — Encounter: Payer: Self-pay | Admitting: Family Medicine

## 2019-02-14 DIAGNOSIS — I10 Essential (primary) hypertension: Secondary | ICD-10-CM

## 2019-02-14 DIAGNOSIS — I152 Hypertension secondary to endocrine disorders: Secondary | ICD-10-CM

## 2019-02-14 DIAGNOSIS — E119 Type 2 diabetes mellitus without complications: Secondary | ICD-10-CM | POA: Diagnosis not present

## 2019-02-14 DIAGNOSIS — E1159 Type 2 diabetes mellitus with other circulatory complications: Secondary | ICD-10-CM | POA: Diagnosis not present

## 2019-02-14 MED ORDER — LOSARTAN POTASSIUM-HCTZ 100-25 MG PO TABS: 1.0000 | ORAL_TABLET | Freq: Every day | ORAL | 1 refills | Status: DC

## 2019-02-14 MED ORDER — AMLODIPINE BESYLATE 2.5 MG PO TABS: 2.5000 mg | ORAL_TABLET | Freq: Every day | ORAL | 1 refills | Status: DC

## 2019-02-14 NOTE — Progress Notes (Signed)
Virtual Visit via Video Note  I connected with Sarah Humphrey  on 02/14/19 at  9:00 AM EDT by a video enabled telemedicine application and verified that I am speaking with the correct person using two identifiers.  Location patient: home Location provider:work or home office Persons participating in the virtual visit: patient, provider  I discussed the limitations of evaluation and management by telemedicine and the availability of in person appointments. The patient expressed understanding and agreed to proceed.   Sarah Humphrey DOB: 11-Apr-1966 Encounter date: 02/14/2019  This is a 53 y.o. female who presents with No chief complaint on file.   History of present illness: Patient asked to have visit to discuss elevated A1c.  On recent blood work she was up to 6.9, with previous last year being at 6.0.  She is not on any medications for blood sugar. Cholesterol has been well controlled without medication although HDL is low at 37. (LDL 80). She does feel like she has a good understanding of how to control blood sugars.   She has been checking her blood pressures at home. Last one was 153/97, this was after taking medication. Has been high since she has been checking at home.     No Known Allergies No outpatient medications have been marked as taking for the 02/14/19 encounter (Telemedicine) with Caren Macadam, MD.    Review of Systems  Constitutional: Negative for chills, fatigue and fever.  Respiratory: Negative for cough, chest tightness, shortness of breath and wheezing.   Cardiovascular: Negative for chest pain, palpitations and leg swelling.    Objective:  There were no vitals taken for this visit.      BP Readings from Last 3 Encounters:  01/24/19 132/90  08/02/17 120/90  04/03/17 136/90   Wt Readings from Last 3 Encounters:  01/24/19 207 lb 12.8 oz (94.3 kg)  08/02/17 203 lb 12.8 oz (92.4 kg)  04/03/17 203 lb 3.2 oz (92.2 kg)    EXAM:  GENERAL: alert,  oriented, appears well and in no acute distress  HEENT: atraumatic, conjunctiva clear, no obvious abnormalities on inspection of external nose and ears  NECK: normal movements of the head and neck  LUNGS: on inspection no signs of respiratory distress, breathing rate appears normal, no obvious gross SOB, gasping or wheezing  CV: no obvious cyanosis  MS: moves all visible extremities without noticeable abnormality  PSYCH/NEURO: pleasant and cooperative, no obvious depression or anxiety, speech and thought processing grossly intact   Assessment/Plan  1. Type 2 diabetes mellitus without complication, without long-term current use of insulin ([place]) She would like to work on diet and exercise. We discussed monitoring carb intake. I am going to send her a diabetic handout and we reviewed big picture of 3 carb choices/ meal. Choosing foods higher in protein and low in carbs. Increasing exercise. She is motivated to lose weight. She will let me know if concerns.  We discussed statin recommendation with DM. We will hold off for now, but continue to monitor sugars and cholesterol and continue to discuss.  2. Hypertension associated with diabetes ([place]) suboptimal control. Add norvasc. Discussed new medication(s) today with patient. Discussed potential side effects and patient verbalized understanding. She will update me in 2-3 weeks with pressures; sooner if any side effects/concerns with med.   - losartan-hydrochlorothiazide (HYZAAR) 100-25 MG tablet; Take 1 tablet by mouth daily.  Dispense: 90 tablet; Refill: 1  Return in about 3 months (around 05/17/2019) for Chronic condition visit.  I discussed the assessment and treatment plan with the patient. The patient was provided an opportunity to ask questions and all were answered. The patient agreed with the plan and demonstrated an understanding of the instructions.   The patient was advised to call back or seek an in-person evaluation if the  symptoms worsen or if the condition fails to improve as anticipated.  I provided 28 minutes of non-face-to-face time during this encounter.   Micheline Rough, MD

## 2019-02-17 ENCOUNTER — Telehealth: Payer: Self-pay | Admitting: *Deleted

## 2019-02-17 NOTE — Telephone Encounter (Signed)
Left a detailed message at the pts cell number to call back for an appt as below.  Handout received from Dr Ethlyn Gallery and I left a message stating this was mailed to her home address.

## 2019-02-17 NOTE — Telephone Encounter (Signed)
-----   Message from Caren Macadam, MD sent at 02/14/2019  9:26 AM EDT ----- Please set up follow up in office visit in 3 months time. (it can be 3 months from last bloodwork). Please mail diabetic handout as well (but I want to add a note to the front of this before you mail it please).

## 2019-02-28 ENCOUNTER — Ambulatory Visit: Payer: BC Managed Care – PPO | Admitting: Family Medicine

## 2019-04-09 DIAGNOSIS — R0600 Dyspnea, unspecified: Secondary | ICD-10-CM | POA: Diagnosis not present

## 2019-04-09 DIAGNOSIS — Z79899 Other long term (current) drug therapy: Secondary | ICD-10-CM | POA: Diagnosis not present

## 2019-04-09 DIAGNOSIS — R635 Abnormal weight gain: Secondary | ICD-10-CM | POA: Diagnosis not present

## 2019-06-05 ENCOUNTER — Other Ambulatory Visit: Payer: Self-pay | Admitting: Family Medicine

## 2019-06-05 DIAGNOSIS — Z1231 Encounter for screening mammogram for malignant neoplasm of breast: Secondary | ICD-10-CM

## 2019-06-11 DIAGNOSIS — F19982 Other psychoactive substance use, unspecified with psychoactive substance-induced sleep disorder: Secondary | ICD-10-CM | POA: Diagnosis not present

## 2019-06-11 DIAGNOSIS — R635 Abnormal weight gain: Secondary | ICD-10-CM | POA: Diagnosis not present

## 2019-06-11 DIAGNOSIS — R0602 Shortness of breath: Secondary | ICD-10-CM | POA: Diagnosis not present

## 2019-07-14 ENCOUNTER — Other Ambulatory Visit: Payer: Self-pay

## 2019-07-14 ENCOUNTER — Ambulatory Visit
Admission: RE | Admit: 2019-07-14 | Discharge: 2019-07-14 | Disposition: A | Discharge status: Home / Self Care | Payer: BC Managed Care – PPO | Source: Ambulatory Visit | Attending: Family Medicine | Admitting: Family Medicine

## 2019-07-14 DIAGNOSIS — Z1231 Encounter for screening mammogram for malignant neoplasm of breast: Secondary | ICD-10-CM | POA: Diagnosis not present

## 2019-08-22 ENCOUNTER — Other Ambulatory Visit: Payer: Self-pay | Admitting: Family Medicine

## 2019-08-22 DIAGNOSIS — E1159 Type 2 diabetes mellitus with other circulatory complications: Secondary | ICD-10-CM

## 2019-08-22 DIAGNOSIS — I152 Hypertension secondary to endocrine disorders: Secondary | ICD-10-CM

## 2019-11-03 DIAGNOSIS — Z03818 Encounter for observation for suspected exposure to other biological agents ruled out: Secondary | ICD-10-CM | POA: Diagnosis not present

## 2019-11-03 DIAGNOSIS — Z20822 Contact with and (suspected) exposure to covid-19: Secondary | ICD-10-CM | POA: Diagnosis not present

## 2019-12-09 ENCOUNTER — Other Ambulatory Visit: Payer: Self-pay | Admitting: Family Medicine

## 2019-12-09 DIAGNOSIS — E1159 Type 2 diabetes mellitus with other circulatory complications: Secondary | ICD-10-CM

## 2019-12-09 DIAGNOSIS — I152 Hypertension secondary to endocrine disorders: Secondary | ICD-10-CM

## 2020-01-26 ENCOUNTER — Ambulatory Visit (INDEPENDENT_AMBULATORY_CARE_PROVIDER_SITE_OTHER): Payer: BC Managed Care – PPO | Admitting: Family Medicine

## 2020-01-26 ENCOUNTER — Encounter: Payer: Self-pay | Admitting: Family Medicine

## 2020-01-26 ENCOUNTER — Other Ambulatory Visit: Payer: Self-pay

## 2020-01-26 VITALS — BP 128/82 | HR 78 | Temp 98.1°F | Ht 68.0 in | Wt 182.6 lb

## 2020-01-26 DIAGNOSIS — K429 Umbilical hernia without obstruction or gangrene: Secondary | ICD-10-CM

## 2020-01-26 DIAGNOSIS — Z13220 Encounter for screening for lipoid disorders: Secondary | ICD-10-CM

## 2020-01-26 DIAGNOSIS — Z23 Encounter for immunization: Secondary | ICD-10-CM

## 2020-01-26 DIAGNOSIS — E1165 Type 2 diabetes mellitus with hyperglycemia: Secondary | ICD-10-CM | POA: Diagnosis not present

## 2020-01-26 DIAGNOSIS — E876 Hypokalemia: Secondary | ICD-10-CM | POA: Diagnosis not present

## 2020-01-26 DIAGNOSIS — E1159 Type 2 diabetes mellitus with other circulatory complications: Secondary | ICD-10-CM

## 2020-01-26 DIAGNOSIS — I152 Hypertension secondary to endocrine disorders: Secondary | ICD-10-CM

## 2020-01-26 DIAGNOSIS — Z0000 Encounter for general adult medical examination without abnormal findings: Secondary | ICD-10-CM | POA: Diagnosis not present

## 2020-01-26 DIAGNOSIS — I10 Essential (primary) hypertension: Secondary | ICD-10-CM | POA: Diagnosis not present

## 2020-01-26 DIAGNOSIS — E611 Iron deficiency: Secondary | ICD-10-CM

## 2020-01-26 NOTE — Patient Instructions (Addendum)
  Great Job with weight loss and change in lifestyle!  Zoster Vaccine, Recombinant injection What is this medicine? ZOSTER VACCINE (ZOS ter vak SEEN) is used to prevent shingles in adults 55 years old and over. This vaccine is not used to treat shingles or nerve pain from shingles. This medicine may be used for other purposes; ask your health care provider or pharmacist if you have questions. COMMON BRAND NAME(S): [facility] What should I tell my health care provider before I take this medicine? They need to know if you have any of these conditions:  blood disorders or disease  cancer like leukemia or lymphoma  immune system problems or therapy  an unusual or allergic reaction to vaccines, other medications, foods, dyes, or preservatives  pregnant or trying to get pregnant  breast-feeding How should I use this medicine? This vaccine is for injection in a muscle. It is given by a health care professional. Talk to your pediatrician regarding the use of this medicine in children. This medicine is not approved for use in children. Overdosage: If you think you have taken too much of this medicine contact a poison control center or emergency room at once. NOTE: This medicine is only for you. Do not share this medicine with others. What if I miss a dose? Keep appointments for follow-up (booster) doses as directed. It is important not to miss your dose. Call your doctor or health care professional if you are unable to keep an appointment. What may interact with this medicine?  medicines that suppress your immune system  medicines to treat cancer  steroid medicines like prednisone or cortisone This list may not describe all possible interactions. Give your health care provider a list of all the medicines, herbs, non-prescription drugs, or dietary supplements you use. Also tell them if you smoke, drink alcohol, or use illegal drugs. Some items may interact with your medicine. What should I  watch for while using this medicine? Visit your doctor for regular check ups. This vaccine, like all vaccines, may not fully protect everyone. What side effects may I notice from receiving this medicine? Side effects that you should report to your doctor or health care professional as soon as possible:  allergic reactions like skin rash, itching or hives, swelling of the face, lips, or tongue  breathing problems Side effects that usually do not require medical attention (report these to your doctor or health care professional if they continue or are bothersome):  chills  headache  fever  nausea, vomiting  redness, warmth, pain, swelling or itching at site where injected  tiredness This list may not describe all possible side effects. Call your doctor for medical advice about side effects. You may report side effects to FDA at 1-800-FDA-1088. Where should I keep my medicine? This vaccine is only given in a clinic, pharmacy, doctor's office, or other health care setting and will not be stored at home. NOTE: This sheet is a summary. It may not cover all possible information. If you have questions about this medicine, talk to your doctor, pharmacist, or health care provider.  2020 Elsevier/Gold Standard (2016-12-11 13:20:30)

## 2020-01-26 NOTE — Progress Notes (Signed)
Sarah Humphrey DOB: [birth year] Encounter date: 01/26/2020  This is a 54 y.o. female who presents for complete physical   History of present illness/Additional concerns: Last visit with me was in October 2020.  At that time we discussed elevated A1c.  She wanted to work on diet and exercise.  Also added Norvasc to help with blood pressure control.  No worries today. Has cut out a lot of carbohydrates, doing a lot of walking. Really feels great with weight loss. Able to run up multiple flights of stairs.   Last pap was 01/24/19 and was normal, hpv negative.  Last mammogram 07/2019 - normal Last colonoscopy 02/2017 repeat in 48yrs  Past Medical History:  Diagnosis Date  . ANEMIA, IRON DEFICIENCY 03/18/2010   Qualifier: Diagnosis of  By: Wynona Luna   . CHEST PAIN, ATYPICAL 04/05/2009   Qualifier: Diagnosis of  By: Wynona Luna /per pt did not have any chest pain  . Diabetes mellitus without complication ([place])    borderline/ no meds per pt.  . Hypertension   . Salivary gland obstruction    -s/p eval with ENT, Dr. Wilburn Cornelia in 2018 for submandibular swelling   Past Surgical History:  Procedure Laterality Date  . TUBAL LIGATION  2002   No Known Allergies Current Meds  Medication Sig  . amLODipine (NORVASC) 2.5 MG tablet TAKE 1 TABLET BY MOUTH ONCE DAILY . APPOINTMENT REQUIRED FOR FUTURE REFILLS  . losartan-hydrochlorothiazide (HYZAAR) 100-25 MG tablet TAKE 1 TABLET BY MOUTH ONCE DAILY . APPOINTMENT REQUIRED FOR FUTURE REFILLS  . potassium chloride (K-DUR) 10 MEQ tablet Take 1 tablet (10 mEq total) by mouth 3 (three) times a week.   Social History   Tobacco Use  . Smoking status: Never Smoker  . Smokeless tobacco: Never Used  Substance Use Topics  . Alcohol use: No   Family History  Problem Relation Age of Onset  . Hypertension Mother   . Other Father        unknowon  . Hypertension Brother   . Breast cancer Neg Hx      Review of Systems  Constitutional:  Negative for activity change, appetite change, chills, fatigue, fever and unexpected weight change.  HENT: Negative for congestion, ear pain, hearing loss, sinus pressure, sinus pain, sore throat and trouble swallowing.   Eyes: Negative for pain and visual disturbance.  Respiratory: Negative for cough, chest tightness, shortness of breath and wheezing.   Cardiovascular: Negative for chest pain, palpitations and leg swelling.  Gastrointestinal: Negative for abdominal pain, blood in stool, constipation, diarrhea, nausea and vomiting.  Genitourinary: Negative for difficulty urinating and menstrual problem.  Musculoskeletal: Negative for arthralgias and back pain.  Skin: Negative for rash.  Neurological: Negative for dizziness, weakness, numbness and headaches.  Hematological: Negative for adenopathy. Does not bruise/bleed easily.  Psychiatric/Behavioral: Negative for sleep disturbance and suicidal ideas. The patient is not nervous/anxious.     CBC:  Lab Results  Component Value Date   WBC 5.2 01/24/2019   HGB 12.3 01/24/2019   HCT 37.6 01/24/2019   MCHC 32.6 01/24/2019   RDW 16.7 (H) 01/24/2019   PLT 210.0 01/24/2019   CMP: Lab Results  Component Value Date   NA 140 01/24/2019   K 3.2 (L) 01/24/2019   CL 101 01/24/2019   CO2 32 01/24/2019   GLUCOSE 124 (H) 01/24/2019   BUN 9 01/24/2019   CREATININE 0.69 01/24/2019   CALCIUM 9.3 01/24/2019   PROT 6.9 01/24/2019   BILITOT  0.6 01/24/2019   ALKPHOS 49 01/24/2019   ALT 12 01/24/2019   AST 13 01/24/2019   LIPID: Lab Results  Component Value Date   CHOL 142 01/24/2019   TRIG 125.0 01/24/2019   HDL 37.60 (L) 01/24/2019   LDLCALC 80 01/24/2019    Objective:  BP 128/82 (BP Location: Left Arm, Patient Position: Sitting, Cuff Size: Large)   Pulse 78   Temp 98.1 F (36.7 C) (Oral)   Ht 5\' 8"  (1.727 m)   Wt 182 lb 9.6 oz (82.8 kg)   SpO2 98%   BMI 27.76 kg/m   Weight: 182 lb 9.6 oz (82.8 kg)   BP Readings from Last 3  Encounters:  01/26/20 128/82  01/24/19 132/90  08/02/17 120/90   Wt Readings from Last 3 Encounters:  01/26/20 182 lb 9.6 oz (82.8 kg)  01/24/19 207 lb 12.8 oz (94.3 kg)  08/02/17 203 lb 12.8 oz (92.4 kg)    Physical Exam Constitutional:      General: She is not in acute distress.    Appearance: She is well-developed.  HENT:     Head: Normocephalic and atraumatic.     Right Ear: External ear normal.     Left Ear: External ear normal.     Mouth/Throat:     Pharynx: No oropharyngeal exudate.  Eyes:     Conjunctiva/sclera: Conjunctivae normal.     Pupils: Pupils are equal, round, and reactive to light.  Neck:     Thyroid: No thyromegaly.  Cardiovascular:     Rate and Rhythm: Normal rate and regular rhythm.     Heart sounds: Murmur heard.  Systolic murmur is present with a grade of 2/6.  No friction rub. No gallop.   Pulmonary:     Effort: Pulmonary effort is normal.     Breath sounds: Normal breath sounds.  Abdominal:     General: Bowel sounds are normal. There is no distension.     Palpations: Abdomen is soft. There is no mass.     Tenderness: There is no abdominal tenderness. There is no guarding.     Hernia: No hernia is present.  Musculoskeletal:        General: No tenderness or deformity. Normal range of motion.     Cervical back: Normal range of motion and neck supple.  Lymphadenopathy:     Cervical: No cervical adenopathy.  Skin:    General: Skin is warm and dry.     Findings: No rash.  Neurological:     Mental Status: She is alert and oriented to person, place, and time.     Deep Tendon Reflexes: Reflexes normal.     Reflex Scores:      Tricep reflexes are 2+ on the right side and 2+ on the left side.      Bicep reflexes are 2+ on the right side and 2+ on the left side.      Brachioradialis reflexes are 2+ on the right side and 2+ on the left side.      Patellar reflexes are 2+ on the right side and 2+ on the left side. Psychiatric:        Speech: Speech  normal.        Behavior: Behavior normal.        Thought Content: Thought content normal.     Assessment/Plan: Health Maintenance Due  Topic Date Due  . FOOT EXAM  12/22/2017  . TETANUS/TDAP  01/15/2019  . HEMOGLOBIN A1C  07/24/2019   Health  Maintenance reviewed - Tdap completed today. Recommended shingrix vaccination; information provided.  1. Preventative health care Keep up great work with healthy eating and exercise! - Tdap vaccine greater than or equal to 7yo IM  2. Hypokalemia Will recheck bloodwork today. Has completely changed diet- more fruits, veggies, etc.  3. Controlled type 2 diabetes mellitus with hyperglycemia, without long-term current use of insulin (HCC) Weight loss, dietary changes and regular exercise - I expect improvement in A1C! - Hemoglobin A1c; Future - Microalbumin / creatinine urine ratio; Future  4. Hypertension associated with diabetes ([place]) Controlled. Continue current medication.  - CBC with Differential/Platelet; Future - Comprehensive metabolic panel; Future  5. Iron deficiency Hg has been stable.   6. Umbilical hernia without obstruction and without gangrene Stable.   7. Lipid screening - Lipid panel; Future  Return in about 6 months (around 07/25/2020) for Chronic condition visit.  Micheline Rough, MD

## 2020-01-27 LAB — COMPREHENSIVE METABOLIC PANEL
AG Ratio: 1.4 (calc) (ref 1.0–2.5)
ALT: 12 U/L (ref 6–29)
AST: 14 U/L (ref 10–35)
Albumin: 4.1 g/dL (ref 3.6–5.1)
Alkaline phosphatase (APISO): 55 U/L (ref 37–153)
BUN: 12 mg/dL (ref 7–25)
CO2: 31 mmol/L (ref 20–32)
Calcium: 9.5 mg/dL (ref 8.6–10.4)
Chloride: 104 mmol/L (ref 98–110)
Creat: 0.76 mg/dL (ref 0.50–1.05)
Globulin: 3 g/dL (calc) (ref 1.9–3.7)
Glucose, Bld: 105 mg/dL — ABNORMAL HIGH (ref 65–99)
Potassium: 3.3 mmol/L — ABNORMAL LOW (ref 3.5–5.3)
Sodium: 143 mmol/L (ref 135–146)
Total Bilirubin: 0.6 mg/dL (ref 0.2–1.2)
Total Protein: 7.1 g/dL (ref 6.1–8.1)

## 2020-01-27 LAB — CBC WITH DIFFERENTIAL/PLATELET
Absolute Monocytes: 396 cells/uL (ref 200–950)
Basophils Absolute: 39 cells/uL (ref 0–200)
Basophils Relative: 0.9 %
Eosinophils Absolute: 60 cells/uL (ref 15–500)
Eosinophils Relative: 1.4 %
HCT: 39.3 % (ref 35.0–45.0)
Hemoglobin: 12.6 g/dL (ref 11.7–15.5)
Lymphs Abs: 1982 cells/uL (ref 850–3900)
MCH: 26.9 pg — ABNORMAL LOW (ref 27.0–33.0)
MCHC: 32.1 g/dL (ref 32.0–36.0)
MCV: 84 fL (ref 80.0–100.0)
MPV: 11.1 fL (ref 7.5–12.5)
Monocytes Relative: 9.2 %
Neutro Abs: 1823 cells/uL (ref 1500–7800)
Neutrophils Relative %: 42.4 %
Platelets: 213 10*3/uL (ref 140–400)
RBC: 4.68 10*6/uL (ref 3.80–5.10)
RDW: 13.7 % (ref 11.0–15.0)
Total Lymphocyte: 46.1 %
WBC: 4.3 10*3/uL (ref 3.8–10.8)

## 2020-01-27 LAB — MICROALBUMIN / CREATININE URINE RATIO
Creatinine, Urine: 114 mg/dL (ref 20–275)
Microalb Creat Ratio: 26 mcg/mg creat (ref <30)
Microalb, Ur: 3 mg/dL

## 2020-01-27 LAB — LIPID PANEL
Cholesterol: 139 mg/dL (ref <200)
HDL: 44 mg/dL — ABNORMAL LOW (ref >=50)
LDL Cholesterol (Calc): 81 mg/dL (calc)
Non-HDL Cholesterol (Calc): 95 mg/dL (calc) (ref <130)
Total CHOL/HDL Ratio: 3.2 (calc) (ref <5.0)
Triglycerides: 64 mg/dL (ref <150)

## 2020-01-27 LAB — HEMOGLOBIN A1C
Hgb A1c MFr Bld: 6.3 % of total Hgb — ABNORMAL HIGH (ref <5.7)
Mean Plasma Glucose: 134 (calc)
eAG (mmol/L): 7.4 (calc)

## 2020-01-28 ENCOUNTER — Telehealth: Payer: Self-pay | Admitting: Family Medicine

## 2020-01-28 NOTE — Telephone Encounter (Signed)
See results note. 

## 2020-01-28 NOTE — Telephone Encounter (Signed)
Pt was returning a call. Stated that it is okay to leave a detailed message in her phone.  [phone]

## 2020-03-01 ENCOUNTER — Other Ambulatory Visit: Payer: Self-pay | Admitting: Family Medicine

## 2020-03-01 DIAGNOSIS — I152 Hypertension secondary to endocrine disorders: Secondary | ICD-10-CM

## 2020-03-01 DIAGNOSIS — E1159 Type 2 diabetes mellitus with other circulatory complications: Secondary | ICD-10-CM

## 2020-05-21 ENCOUNTER — Other Ambulatory Visit: Payer: Self-pay | Admitting: Family Medicine

## 2020-06-25 ENCOUNTER — Other Ambulatory Visit: Payer: Self-pay | Admitting: Family Medicine

## 2020-06-25 DIAGNOSIS — E1159 Type 2 diabetes mellitus with other circulatory complications: Secondary | ICD-10-CM

## 2020-06-25 DIAGNOSIS — I152 Hypertension secondary to endocrine disorders: Secondary | ICD-10-CM

## 2020-06-28 ENCOUNTER — Other Ambulatory Visit: Payer: Self-pay | Admitting: Family Medicine

## 2020-06-28 DIAGNOSIS — I152 Hypertension secondary to endocrine disorders: Secondary | ICD-10-CM

## 2020-09-02 ENCOUNTER — Other Ambulatory Visit: Payer: Self-pay | Admitting: Family Medicine

## 2020-09-02 DIAGNOSIS — Z1231 Encounter for screening mammogram for malignant neoplasm of breast: Secondary | ICD-10-CM

## 2020-09-22 ENCOUNTER — Other Ambulatory Visit: Payer: Self-pay

## 2020-09-22 ENCOUNTER — Encounter: Payer: Self-pay | Admitting: Family Medicine

## 2020-09-22 ENCOUNTER — Ambulatory Visit (INDEPENDENT_AMBULATORY_CARE_PROVIDER_SITE_OTHER): Payer: BLUE CROSS/BLUE SHIELD | Admitting: Family Medicine

## 2020-09-22 DIAGNOSIS — E1159 Type 2 diabetes mellitus with other circulatory complications: Secondary | ICD-10-CM | POA: Diagnosis not present

## 2020-09-22 DIAGNOSIS — E119 Type 2 diabetes mellitus without complications: Secondary | ICD-10-CM

## 2020-09-22 DIAGNOSIS — E785 Hyperlipidemia, unspecified: Secondary | ICD-10-CM

## 2020-09-22 DIAGNOSIS — I152 Hypertension secondary to endocrine disorders: Secondary | ICD-10-CM

## 2020-09-22 DIAGNOSIS — E1169 Type 2 diabetes mellitus with other specified complication: Secondary | ICD-10-CM | POA: Diagnosis not present

## 2020-09-22 DIAGNOSIS — E876 Hypokalemia: Secondary | ICD-10-CM

## 2020-09-22 LAB — LIPID PANEL
Cholesterol: 168 mg/dL (ref 0–200)
HDL: 46.4 mg/dL (ref >39.00)
LDL Cholesterol: 107 mg/dL — ABNORMAL HIGH (ref 0–99)
NonHDL: 121.11
Total CHOL/HDL Ratio: 4
Triglycerides: 70 mg/dL (ref 0.0–149.0)
VLDL: 14 mg/dL (ref 0.0–40.0)

## 2020-09-22 LAB — COMPREHENSIVE METABOLIC PANEL
ALT: 12 U/L (ref 0–35)
AST: 12 U/L (ref 0–37)
Albumin: 4.4 g/dL (ref 3.5–5.2)
Alkaline Phosphatase: 59 U/L (ref 39–117)
BUN: 18 mg/dL (ref 6–23)
CO2: 33 mEq/L — ABNORMAL HIGH (ref 19–32)
Calcium: 10 mg/dL (ref 8.4–10.5)
Chloride: 102 mEq/L (ref 96–112)
Creatinine, Ser: 0.71 mg/dL (ref 0.40–1.20)
GFR: 95.74 mL/min (ref >60.00)
Glucose, Bld: 93 mg/dL (ref 70–99)
Potassium: 3.4 mEq/L — ABNORMAL LOW (ref 3.5–5.1)
Sodium: 142 mEq/L (ref 135–145)
Total Bilirubin: 0.5 mg/dL (ref 0.2–1.2)
Total Protein: 7.5 g/dL (ref 6.0–8.3)

## 2020-09-22 LAB — CBC WITH DIFFERENTIAL/PLATELET
Basophils Absolute: 0 10*3/uL (ref 0.0–0.1)
Basophils Relative: 0.2 % (ref 0.0–3.0)
Eosinophils Absolute: 0.1 10*3/uL (ref 0.0–0.7)
Eosinophils Relative: 1.1 % (ref 0.0–5.0)
HCT: 39.1 % (ref 36.0–46.0)
Hemoglobin: 13.3 g/dL (ref 12.0–15.0)
Lymphocytes Relative: 41.6 % (ref 12.0–46.0)
Lymphs Abs: 2.3 10*3/uL (ref 0.7–4.0)
MCHC: 33.9 g/dL (ref 30.0–36.0)
MCV: 83.4 fl (ref 78.0–100.0)
Monocytes Absolute: 0.4 10*3/uL (ref 0.1–1.0)
Monocytes Relative: 6.6 % (ref 3.0–12.0)
Neutro Abs: 2.8 10*3/uL (ref 1.4–7.7)
Neutrophils Relative %: 50.5 % (ref 43.0–77.0)
Platelets: 224 10*3/uL (ref 150.0–400.0)
RBC: 4.69 Mil/uL (ref 3.87–5.11)
RDW: 13.8 % (ref 11.5–15.5)
WBC: 5.5 10*3/uL (ref 4.0–10.5)

## 2020-09-22 LAB — HEMOGLOBIN A1C: Hgb A1c MFr Bld: 6.5 % (ref 4.6–6.5)

## 2020-09-22 MED ORDER — LOSARTAN POTASSIUM-HCTZ 100-25 MG PO TABS
ORAL_TABLET | ORAL | 1 refills | Status: DC
Start: 2020-09-22 — End: 2021-03-24

## 2020-09-22 MED ORDER — AMLODIPINE BESYLATE 5 MG PO TABS: 5.0000 mg | ORAL_TABLET | Freq: Every day | ORAL | 1 refills | Status: DC

## 2020-09-22 NOTE — Patient Instructions (Signed)
We are going to increase your amlodipine to 5mg  daily. Continue with the losartan-hctz previous doses. I refilled medications for you today.   Let me know if your pressures are not in our goal range (110-135/70-85) in a couple of weeks.

## 2020-09-22 NOTE — Progress Notes (Signed)
Sarah Humphrey DOB: [birth year] Encounter date: 09/22/2020  This is a 55 y.o. female who presents with Chief Complaint  Patient presents with  . Follow-up    History of present illness: No worries today; just concerned that even with bp medication she is still getting high numbers - 144/98. Most numbers at home are in the 016'W systolic. She states that she is good about taking medication regularly.   She is getting some headaches - wanted to get FMLA paperwork completed for this. Forces self to go to work and work through. Gets them about every month - sometimes for a week. Tries not to take medication. Uses aloe vera gel on forehead that sometimes helps. Hasn't noticed if headache is related to bp being elevated.   Still working on eating healthy. She is walking regularly.   Waking at least once at night - bathroom; harder to sleep. Does not always feel rested.   No Known Allergies Current Meds  Medication Sig  . amLODipine (NORVASC) 2.5 MG tablet TAKE 1 TABLET BY MOUTH ONCE DAILY . APPOINTMENT REQUIRED FOR FUTURE REFILLS  . losartan-hydrochlorothiazide (HYZAAR) 100-25 MG tablet TAKE 1 TABLET BY MOUTH ONCE DAILY . APPOINTMENT REQUIRED FOR FUTURE REFILLS  . potassium chloride (KLOR-CON) 10 MEQ tablet TAKE 1  BY MOUTH THREE TIMES A WEEK    Review of Systems  Constitutional: Negative for chills, fatigue and fever.  Respiratory: Negative for cough, chest tightness, shortness of breath and wheezing.   Cardiovascular: Negative for chest pain, palpitations and leg swelling.    Objective:  BP 140/80 (BP Location: Left Arm, Patient Position: Sitting, Cuff Size: Large)   Pulse 70   Temp 98.3 F (36.8 C) (Oral)   Ht 5\' 8"  (1.727 m)   Wt 187 lb 3.2 oz (84.9 kg)   SpO2 99%   BMI 28.46 kg/m   Weight: 187 lb 3.2 oz (84.9 kg)   BP Readings from Last 3 Encounters:  09/22/20 140/80  01/26/20 128/82  01/24/19 132/90   Wt Readings from Last 3 Encounters:  09/22/20 187 lb 3.2 oz  (84.9 kg)  01/26/20 182 lb 9.6 oz (82.8 kg)  01/24/19 207 lb 12.8 oz (94.3 kg)    Physical Exam Constitutional:      General: She is not in acute distress.    Appearance: She is well-developed.  Cardiovascular:     Rate and Rhythm: Normal rate and regular rhythm.     Heart sounds: Normal heart sounds. No murmur heard. No friction rub.  Pulmonary:     Effort: Pulmonary effort is normal. No respiratory distress.     Breath sounds: Normal breath sounds. No wheezing or rales.  Musculoskeletal:     Right lower leg: No edema.     Left lower leg: No edema.  Neurological:     Mental Status: She is alert and oriented to person, place, and time.  Psychiatric:        Behavior: Behavior normal.     Assessment/Plan  1. Hypertension associated with diabetes ([place]) Increase amlodipine to 5 mg daily.  Continue with previous losartan hydrochlorothiazide dose.  We can consider combination therapy once we get blood pressure well controlled.  I have asked her to check her blood pressures at home and let me know if she is not in a normal range within a couple weeks. - amLODipine (NORVASC) 5 MG tablet; Take 1 tablet (5 mg total) by mouth daily.  Dispense: 90 tablet; Refill: 1 - CBC with Differential/Platelet; Future -  Comprehensive metabolic panel; Future - losartan-hydrochlorothiazide (HYZAAR) 100-25 MG tablet; TAKE 1 TABLET BY MOUTH ONCE DAILY . APPOINTMENT REQUIRED FOR FUTURE REFILLS  Dispense: 90 tablet; Refill: 1 - CBC with Differential/Platelet - Comprehensive metabolic panel  2. Type 2 diabetes mellitus without complication, without long-term current use of insulin (HCC) Currently diet controlled.  We will continue to monitor.  I encouraged her to keep up with regular activity exercise and low carbohydrate eating. - Hemoglobin A1c; Future - Hemoglobin A1c  3. Hypokalemia Recheck blood work today.  She has not been taking potassium.  4. Hyperlipidemia associated with type 2 diabetes  mellitus ([place]) We discussed consideration for statin to help with preventative measures.  We will discuss further once we get an updated lipid panel. - Lipid panel; Future - Lipid panel   Return in about 6 months (around 03/25/2021) for physical exam.     Micheline Rough, MD

## 2020-09-27 ENCOUNTER — Encounter: Payer: Self-pay | Admitting: *Deleted

## 2020-09-27 MED ORDER — POTASSIUM CHLORIDE CRYS ER 10 MEQ PO TBCR: EXTENDED_RELEASE_TABLET | ORAL | 0 refills | Status: DC

## 2020-09-27 NOTE — Addendum Note (Signed)
Addended by: Agnes Lawrence on: 09/27/2020 04:50 PM   Modules accepted: Orders

## 2020-09-29 ENCOUNTER — Telehealth: Payer: Self-pay | Admitting: Family Medicine

## 2020-09-29 NOTE — Telephone Encounter (Signed)
Patient dropped off paperwork that she would like Dr. Ethlyn Gallery to complete.  Patient would like a call when paperwork is ready for pickup at [phone].  Paperwork will be placed in folder.  Please advise.

## 2020-09-30 NOTE — Telephone Encounter (Signed)
Pt is calling in stating that she is returning the call to the office about pw and was not able to answer due to her being at work.  Pt would like to have a call back.

## 2020-09-30 NOTE — Telephone Encounter (Signed)
I spoke with her and got answers I needed; I will complete rest of questions and return to you.

## 2020-09-30 NOTE — Telephone Encounter (Signed)
Left a detailed message at the patient's home number stating the forms were left at the front desk for pick up.  Copy sent to be scanned.

## 2020-10-27 ENCOUNTER — Encounter: Payer: Self-pay | Admitting: Family Medicine

## 2020-10-27 ENCOUNTER — Other Ambulatory Visit: Payer: Self-pay

## 2020-10-27 ENCOUNTER — Telehealth (INDEPENDENT_AMBULATORY_CARE_PROVIDER_SITE_OTHER): Payer: BLUE CROSS/BLUE SHIELD | Admitting: Family Medicine

## 2020-10-27 ENCOUNTER — Ambulatory Visit
Admission: RE | Admit: 2020-10-27 | Discharge: 2020-10-27 | Disposition: A | Discharge status: Home / Self Care | Payer: BLUE CROSS/BLUE SHIELD | Source: Ambulatory Visit | Attending: Family Medicine | Admitting: Family Medicine

## 2020-10-27 DIAGNOSIS — I152 Hypertension secondary to endocrine disorders: Secondary | ICD-10-CM | POA: Diagnosis not present

## 2020-10-27 DIAGNOSIS — E1159 Type 2 diabetes mellitus with other circulatory complications: Secondary | ICD-10-CM | POA: Diagnosis not present

## 2020-10-27 DIAGNOSIS — E119 Type 2 diabetes mellitus without complications: Secondary | ICD-10-CM

## 2020-10-27 DIAGNOSIS — Z1231 Encounter for screening mammogram for malignant neoplasm of breast: Secondary | ICD-10-CM

## 2020-10-27 MED ORDER — POTASSIUM CHLORIDE CRYS ER 10 MEQ PO TBCR: EXTENDED_RELEASE_TABLET | ORAL | 1 refills | Status: AC

## 2020-10-27 NOTE — Progress Notes (Signed)
Virtual Visit via Video Note  I connected with Sarah Humphrey on 10/27/20 at  1:30 PM EDT by a video enabled telemedicine application and verified that I am speaking with the correct person using two identifiers.  Location patient: home Location provider: [place], [place] [address] Persons participating in the virtual visit: patient, provider  I discussed the limitations of evaluation and management by telemedicine and the availability of in person appointments. The patient expressed understanding and agreed to proceed.   Sarah Humphrey DOB: 1966-04-25 Encounter date: 10/27/2020  This is a 55 y.o. female who presents with Chief Complaint  Patient presents with   Results    Office visit to discuss results from lab tests performed on May 11th   Hypertension    Patient states she is concerned about elevated blood pressure recent readings of: 160/110, 149/104, 154/94, taking medications daily as instructed    History of present illness:   HTN: she feels like her pressure hasn't been "normal" since she has started taking bp medications. Not having headaches, cp/pressure. She is taking amlodipine 5mg  in evening, hyzaar 100-25mg  in morning. 137/89 reading today. Yesterday she got readings typed into chief complaint above. Has had some in the 120-130 range over 85-90. 128/85 was lowest pressure that she has seen. Weight has been about the same. Eating about the same - good about eating veggies, fruits, chicken, fish. No fried foods - baked or cooked on stove. Went back into office to work and had more "sweets" exposure.   Will start back walking in evening once weather is a little cooler.   Energy level is great. Doesn't feel like she gets tired.   No Known Allergies Current Meds  Medication Sig   amLODipine (NORVASC) 5 MG tablet Take 1 tablet (5 mg total) by mouth daily.   losartan-hydrochlorothiazide (HYZAAR) 100-25 MG tablet TAKE 1 TABLET BY  MOUTH ONCE DAILY . APPOINTMENT REQUIRED FOR FUTURE REFILLS   potassium chloride (KLOR-CON) 10 MEQ tablet TAKE 1  BY MOUTH THREE TIMES A WEEK    Review of Systems  Constitutional:  Negative for chills, fatigue and fever.  Respiratory:  Negative for cough, chest tightness, shortness of breath and wheezing.   Cardiovascular:  Negative for chest pain, palpitations and leg swelling.   Objective:  There were no vitals taken for this visit.      BP Readings from Last 3 Encounters:  09/22/20 140/80  01/26/20 128/82  01/24/19 132/90   Wt Readings from Last 3 Encounters:  09/22/20 187 lb 3.2 oz (84.9 kg)  01/26/20 182 lb 9.6 oz (82.8 kg)  01/24/19 207 lb 12.8 oz (94.3 kg)    EXAM:  GENERAL: alert, oriented, appears well and in no acute distress  HEENT: atraumatic, conjunctiva clear, no obvious abnormalities on inspection of external nose and ears  NECK: normal movements of the head and neck  LUNGS: on inspection no signs of respiratory distress, breathing rate appears normal, no obvious gross SOB, gasping or wheezing  CV: no obvious cyanosis  MS: moves all visible extremities without noticeable abnormality  PSYCH/NEURO: pleasant and cooperative, no obvious depression or anxiety, speech and thought processing grossly intact   Assessment/Plan  1. Hypertension associated with diabetes ([place]) Increase amlodipine to 10mg  daily; continue hyzaar 100-25mg  daily. Continue to take the potassium 47meq three times/week. She will update me through mychart in 2-3 weeks with numbers. Discussed risk of swelling with increased dose amlodipine.  2. Type 2  diabetes mellitus without complication, without long-term current use of insulin ([place]) She is working on healthy eating and regular exercise. She would like to manage without medications if possible.      I discussed the assessment and treatment plan with the patient. The patient was provided an opportunity to ask questions and all were  answered. The patient agreed with the plan and demonstrated an understanding of the instructions.   The patient was advised to call back or seek an in-person evaluation if the symptoms worsen or if the condition fails to improve as anticipated.  I provided 20 minutes of non-face-to-face time during this encounter. Return for pending mychart review.   Micheline Rough, MD

## 2020-10-27 NOTE — Patient Instructions (Signed)
We are going to increase amlodipine to 10mg  daily; update me through mychart in about 3 weeks with numbers.

## 2020-12-05 DIAGNOSIS — M722 Plantar fascial fibromatosis: Secondary | ICD-10-CM | POA: Diagnosis not present

## 2020-12-08 ENCOUNTER — Encounter: Payer: Self-pay | Admitting: Family Medicine

## 2021-03-10 ENCOUNTER — Other Ambulatory Visit: Payer: Self-pay | Admitting: Family Medicine

## 2021-03-10 DIAGNOSIS — I152 Hypertension secondary to endocrine disorders: Secondary | ICD-10-CM

## 2021-03-10 DIAGNOSIS — E1159 Type 2 diabetes mellitus with other circulatory complications: Secondary | ICD-10-CM

## 2021-03-16 ENCOUNTER — Encounter: Payer: BLUE CROSS/BLUE SHIELD | Admitting: Family Medicine

## 2021-03-23 ENCOUNTER — Ambulatory Visit (INDEPENDENT_AMBULATORY_CARE_PROVIDER_SITE_OTHER): Payer: BLUE CROSS/BLUE SHIELD | Admitting: Family Medicine

## 2021-03-23 ENCOUNTER — Encounter: Payer: Self-pay | Admitting: Family Medicine

## 2021-03-23 ENCOUNTER — Other Ambulatory Visit: Payer: Self-pay

## 2021-03-23 VITALS — BP 118/82 | HR 56 | Temp 98.0°F | Ht 67.75 in | Wt 194.7 lb

## 2021-03-23 DIAGNOSIS — Z23 Encounter for immunization: Secondary | ICD-10-CM

## 2021-03-23 DIAGNOSIS — I10 Essential (primary) hypertension: Secondary | ICD-10-CM

## 2021-03-23 DIAGNOSIS — E1159 Type 2 diabetes mellitus with other circulatory complications: Secondary | ICD-10-CM

## 2021-03-23 DIAGNOSIS — R739 Hyperglycemia, unspecified: Secondary | ICD-10-CM | POA: Diagnosis not present

## 2021-03-23 DIAGNOSIS — Z7184 Encounter for health counseling related to travel: Secondary | ICD-10-CM

## 2021-03-23 DIAGNOSIS — Z0000 Encounter for general adult medical examination without abnormal findings: Secondary | ICD-10-CM

## 2021-03-23 DIAGNOSIS — I152 Hypertension secondary to endocrine disorders: Secondary | ICD-10-CM

## 2021-03-23 DIAGNOSIS — E785 Hyperlipidemia, unspecified: Secondary | ICD-10-CM

## 2021-03-23 NOTE — Patient Instructions (Signed)
*  check old vaccination card. I would suggest checking with health department about recommended vaccines. Sometimes a booster of the yellow fever vaccine is suggested if your last shot was over 10years ago; may depend on area you are traveling to. I would call health department for "travel vaccines" as soon as possible to make sure you get what you need prior to trip. I can help with most vaccinations except for the yellow fever if needed; you can also check with them regarding malaria prophylaxis.

## 2021-03-23 NOTE — Progress Notes (Signed)
Sarah Humphrey DOB: 1965-07-02 Encounter date: 03/23/2021  This is a 55 y.o. female who presents for complete physical   History of present illness/Additional concerns: Last visit was virtual on 10/27/2020.  At that visit we increased amlodipine to 10 mg daily and continued Hyzaar 100-25 mg daily.  We will continue with the potassium 10 mg 3 times a week. Has been checking at home. States that some of numbers are ok, but has seen some higher as well (140's). Might have seen as low as 119; left bp log in car today.   A1c was at 6.5 on 09/22/2020.  And we discussed potential for diabetic treatment at that time.  She elected to manage blood sugar with lifestyle modifications. Not exercising as much as she wants, but staying busy, walking a lot. Eating healthy still.   Last mammogram was 10/2020 and was normal.  Last colonoscopy was 2018.  Last pap 2020.   Past Medical History:  Diagnosis Date   ANEMIA, IRON DEFICIENCY 03/18/2010   Qualifier: Diagnosis of  By: Wynona Luna    CHEST PAIN, ATYPICAL 04/05/2009   Qualifier: Diagnosis of  By: Wynona Luna /per pt did not have any chest pain   Diabetes mellitus without complication ([place])    borderline/ no meds per pt.   Hypertension    Salivary gland obstruction    -s/p eval with ENT, Dr. Wilburn Cornelia in 2018 for submandibular swelling   Past Surgical History:  Procedure Laterality Date   TUBAL LIGATION  2002   Allergies  Allergen Reactions   Chloroquine Itching   Current Meds  Medication Sig   amLODipine (NORVASC) 5 MG tablet Take 1 tablet by mouth once daily   potassium chloride (KLOR-CON) 10 MEQ tablet TAKE 1  BY MOUTH THREE TIMES A WEEK   [DISCONTINUED] losartan-hydrochlorothiazide (HYZAAR) 100-25 MG tablet TAKE 1 TABLET BY MOUTH ONCE DAILY . APPOINTMENT REQUIRED FOR FUTURE REFILLS   Social History   Tobacco Use   Smoking status: Never   Smokeless tobacco: Never  Substance Use Topics   Alcohol use: No   Family History   Problem Relation Age of Onset   Hypertension Mother    Other Father        unknowon   Hypertension Brother    Breast cancer Neg Hx      Review of Systems  Constitutional:  Negative for activity change, appetite change, chills, fatigue, fever and unexpected weight change.  HENT:  Negative for congestion, ear pain, hearing loss, sinus pressure, sinus pain, sore throat and trouble swallowing.   Eyes:  Negative for pain and visual disturbance.  Respiratory:  Negative for cough, chest tightness, shortness of breath and wheezing.   Cardiovascular:  Negative for chest pain, palpitations and leg swelling.  Gastrointestinal:  Negative for abdominal pain, blood in stool, constipation, diarrhea, nausea and vomiting.  Genitourinary:  Negative for difficulty urinating and menstrual problem.  Musculoskeletal:  Negative for arthralgias and back pain.  Skin:  Negative for rash.  Neurological:  Negative for dizziness, weakness, numbness and headaches.  Hematological:  Negative for adenopathy. Does not bruise/bleed easily.  Psychiatric/Behavioral:  Negative for sleep disturbance and suicidal ideas. The patient is not nervous/anxious.    CBC:  Lab Results  Component Value Date   WBC 5.5 09/22/2020   HGB 13.3 09/22/2020   HCT 39.1 09/22/2020   MCH 26.9 (L) 01/26/2020   MCHC 33.9 09/22/2020   RDW 13.8 09/22/2020   PLT 224.0 09/22/2020  MPV 11.1 01/26/2020   CMP: Lab Results  Component Value Date   NA 142 09/22/2020   K 3.4 (L) 09/22/2020   CL 102 09/22/2020   CO2 33 (H) 09/22/2020   GLUCOSE 93 09/22/2020   BUN 18 09/22/2020   CREATININE 0.71 09/22/2020   CREATININE 0.76 01/26/2020   CALCIUM 10.0 09/22/2020   PROT 7.5 09/22/2020   BILITOT 0.5 09/22/2020   ALKPHOS 59 09/22/2020   ALT 12 09/22/2020   AST 12 09/22/2020   LIPID: Lab Results  Component Value Date   CHOL 168 09/22/2020   TRIG 70.0 09/22/2020   HDL 46.40 09/22/2020   LDLCALC 107 (H) 09/22/2020   LDLCALC 81  01/26/2020    Objective:  BP 118/82 (BP Location: Right Arm, Patient Position: Sitting, Cuff Size: Large)   Pulse (!) 56   Temp 98 F (36.7 C) (Oral)   Ht 5' 7.75" (1.721 m)   Wt 194 lb 11.2 oz (88.3 kg)   SpO2 99%   BMI 29.82 kg/m   Weight: 194 lb 11.2 oz (88.3 kg)   BP Readings from Last 3 Encounters:  03/23/21 118/82  09/22/20 140/80  01/26/20 128/82   Wt Readings from Last 3 Encounters:  03/23/21 194 lb 11.2 oz (88.3 kg)  09/22/20 187 lb 3.2 oz (84.9 kg)  01/26/20 182 lb 9.6 oz (82.8 kg)    Physical Exam Constitutional:      General: She is not in acute distress.    Appearance: She is well-developed.  HENT:     Head: Normocephalic and atraumatic.     Right Ear: External ear normal.     Left Ear: External ear normal.     Mouth/Throat:     Pharynx: No oropharyngeal exudate.  Eyes:     Conjunctiva/sclera: Conjunctivae normal.     Pupils: Pupils are equal, round, and reactive to light.  Neck:     Thyroid: No thyromegaly.  Cardiovascular:     Rate and Rhythm: Normal rate and regular rhythm.     Heart sounds: Normal heart sounds. No murmur heard.   No friction rub. No gallop.  Pulmonary:     Effort: Pulmonary effort is normal.     Breath sounds: Normal breath sounds.  Abdominal:     General: Bowel sounds are normal. There is no distension.     Palpations: Abdomen is soft. There is no mass.     Tenderness: There is no abdominal tenderness. There is no guarding.     Hernia: No hernia is present.  Genitourinary:    General: Normal vulva.     Vagina: Normal.     Cervix: Normal.     Uterus: Normal.      Adnexa: Right adnexa normal and left adnexa normal.  Musculoskeletal:        General: No tenderness or deformity. Normal range of motion.     Cervical back: Normal range of motion and neck supple.  Lymphadenopathy:     Cervical: No cervical adenopathy.  Skin:    General: Skin is warm and dry.     Findings: No rash.  Neurological:     Mental Status: She is  alert and oriented to person, place, and time.     Deep Tendon Reflexes: Reflexes normal.     Reflex Scores:      Tricep reflexes are 2+ on the right side and 2+ on the left side.      Bicep reflexes are 2+ on the right side and 2+ on  the left side.      Brachioradialis reflexes are 2+ on the right side and 2+ on the left side.      Patellar reflexes are 2+ on the right side and 2+ on the left side. Psychiatric:        Speech: Speech normal.        Behavior: Behavior normal.        Thought Content: Thought content normal.    Assessment/Plan: Health Maintenance Due  Topic Date Due   Pneumococcal Vaccine 75-30 Years old (2 - PCV) 08/31/2016   FOOT EXAM  12/22/2017   Health Maintenance reviewed - up to date with preventative care.  1. Preventative health care Discussed regular exercise and low carb eating to maintain blood sugar.   2. Hyperglycemia Labwork today; consider diabetic treatment if A1C elevating. She prefers to control with lifestyle behaviors if possible.  - Hemoglobin A1c; Future - Microalbumin / creatinine urine ratio; Future - Microalbumin / creatinine urine ratio - Hemoglobin A1c  3. Hypertension, unspecified type Well controlled. Continue current medications.  - CBC with Differential/Platelet; Future - Comprehensive metabolic panel; Future - Comprehensive metabolic panel - CBC with Differential/Platelet  4. Hyperlipidemia, unspecified hyperlipidemia type - Lipid panel; Future - Lipid panel  5. Need for immunization against influenza - Flu Vaccine QUAD 6+ mos PF IM (Fluarix Quad PF)  6. Need for shingles vaccine - Varicella-zoster vaccine IM (Shingrix)  7. Counseling for travel She is traveling to [place]. Reviewed and printed out CDC guidance for travelers and suggested visit to health dept for yellow fever (she thinks she had in past and will look for prior immunization card). She can message me through [facility] for help to decide what immunizations she  will need before trip next month.   Return in about 6 months (around 09/20/2021) for Chronic condition visit.  Micheline Rough, MD

## 2021-03-24 LAB — MICROALBUMIN / CREATININE URINE RATIO
Creatinine,U: 172 mg/dL
Microalb Creat Ratio: 2.7 mg/g (ref 0.0–30.0)
Microalb, Ur: 4.7 mg/dL — ABNORMAL HIGH (ref 0.0–1.9)

## 2021-03-24 LAB — LIPID PANEL
Cholesterol: 177 mg/dL (ref 0–200)
HDL: 46.2 mg/dL (ref >39.00)
LDL Cholesterol: 108 mg/dL — ABNORMAL HIGH (ref 0–99)
NonHDL: 130.7
Total CHOL/HDL Ratio: 4
Triglycerides: 116 mg/dL (ref 0.0–149.0)
VLDL: 23.2 mg/dL (ref 0.0–40.0)

## 2021-03-24 LAB — CBC WITH DIFFERENTIAL/PLATELET
Basophils Absolute: 0.1 10*3/uL (ref 0.0–0.1)
Basophils Relative: 1.2 % (ref 0.0–3.0)
Eosinophils Absolute: 0.1 10*3/uL (ref 0.0–0.7)
Eosinophils Relative: 1.5 % (ref 0.0–5.0)
HCT: 37.5 % (ref 36.0–46.0)
Hemoglobin: 12.4 g/dL (ref 12.0–15.0)
Lymphocytes Relative: 40.4 % (ref 12.0–46.0)
Lymphs Abs: 2.1 10*3/uL (ref 0.7–4.0)
MCHC: 33 g/dL (ref 30.0–36.0)
MCV: 84 fl (ref 78.0–100.0)
Monocytes Absolute: 0.3 10*3/uL (ref 0.1–1.0)
Monocytes Relative: 6.4 % (ref 3.0–12.0)
Neutro Abs: 2.6 10*3/uL (ref 1.4–7.7)
Neutrophils Relative %: 50.5 % (ref 43.0–77.0)
Platelets: 223 10*3/uL (ref 150.0–400.0)
RBC: 4.47 Mil/uL (ref 3.87–5.11)
RDW: 13.6 % (ref 11.5–15.5)
WBC: 5.2 10*3/uL (ref 4.0–10.5)

## 2021-03-24 LAB — COMPREHENSIVE METABOLIC PANEL
ALT: 15 U/L (ref 0–35)
AST: 16 U/L (ref 0–37)
Albumin: 4.3 g/dL (ref 3.5–5.2)
Alkaline Phosphatase: 53 U/L (ref 39–117)
BUN: 13 mg/dL (ref 6–23)
CO2: 31 mEq/L (ref 19–32)
Calcium: 9.6 mg/dL (ref 8.4–10.5)
Chloride: 102 mEq/L (ref 96–112)
Creatinine, Ser: 0.63 mg/dL (ref 0.40–1.20)
GFR: 99.53 mL/min (ref >60.00)
Glucose, Bld: 86 mg/dL (ref 70–99)
Potassium: 3.2 mEq/L — ABNORMAL LOW (ref 3.5–5.1)
Sodium: 140 mEq/L (ref 135–145)
Total Bilirubin: 0.6 mg/dL (ref 0.2–1.2)
Total Protein: 7.7 g/dL (ref 6.0–8.3)

## 2021-03-24 LAB — HEMOGLOBIN A1C: Hgb A1c MFr Bld: 7.1 % — ABNORMAL HIGH (ref 4.6–6.5)

## 2021-03-24 MED ORDER — LOSARTAN POTASSIUM-HCTZ 100-25 MG PO TABS: ORAL_TABLET | ORAL | 1 refills | Status: DC

## 2021-03-25 ENCOUNTER — Encounter: Payer: BLUE CROSS/BLUE SHIELD | Admitting: Family Medicine

## 2021-03-25 MED ORDER — ROSUVASTATIN CALCIUM 5 MG PO TABS: ORAL_TABLET | ORAL | 1 refills | Status: DC

## 2021-03-25 NOTE — Addendum Note (Signed)
Addended by: Agnes Lawrence on: 03/25/2021 10:31 AM   Modules accepted: Orders

## 2021-04-06 ENCOUNTER — Telehealth: Payer: Self-pay | Admitting: Family Medicine

## 2021-04-06 MED ORDER — ROSUVASTATIN CALCIUM 5 MG PO TABS: 5.0000 mg | ORAL_TABLET | Freq: Every day | ORAL | 0 refills | Status: DC

## 2021-04-06 NOTE — Telephone Encounter (Signed)
Spoke with the patient and informed her of the message below.  Patient denied any problems while taking the Rx as below.  Per PCP's approval a new Rx was sent to take 5mg  daily.

## 2021-04-06 NOTE — Telephone Encounter (Signed)
Spoke with the patient and she stated she forgot and was taking Rosuvastatin 5mg  three times a day for the past 1-2 weeks, instead of three times a week and questioned what she should do?  Message sent to PCP.

## 2021-04-06 NOTE — Telephone Encounter (Signed)
Well that means she is tolerating it! If she didn't have trouble with that, then I would suggest just taking 5mg  daily (rather than a few days/week)  Dose she was taking is not a problem, so no worries.

## 2021-04-06 NOTE — Telephone Encounter (Signed)
Pt is calling and would like Sarah Humphrey to return her call concerning a question about her labwork

## 2021-04-12 ENCOUNTER — Telehealth: Payer: Self-pay | Admitting: *Deleted

## 2021-04-12 NOTE — Telephone Encounter (Signed)
-----   Message from Caren Macadam, MD sent at 04/10/2021 12:01 PM EST ----- Increase home potassium dose to daily since levels are low and she has been taking for past month. Sorry I completed result note that this was supposed to be attached to.

## 2021-04-12 NOTE — Telephone Encounter (Signed)
Left a message for the patient to return my call.  

## 2021-04-13 ENCOUNTER — Telehealth: Payer: Self-pay | Admitting: Family Medicine

## 2021-04-13 NOTE — Telephone Encounter (Signed)
See prior note-patient informed.

## 2021-04-13 NOTE — Telephone Encounter (Signed)
Patient informed of the message below.

## 2021-04-13 NOTE — Telephone Encounter (Signed)
Patient returned Sarah Humphrey's call.

## 2021-04-13 NOTE — Telephone Encounter (Signed)
Left a message for the patient to return my call.  

## 2021-04-19 ENCOUNTER — Telehealth: Payer: Self-pay

## 2021-04-19 NOTE — Telephone Encounter (Signed)
Left a message at the cell number requesting the patient call back with more detailed information.

## 2021-04-19 NOTE — Telephone Encounter (Signed)
Patient called asking for appt for Immunizations patient stated she has already spoken with PCP about this.

## 2021-04-19 NOTE — Telephone Encounter (Signed)
Patient called in to let Wendie Simmer know that the immunization shots are for travel due to her traveling out of the country soon.

## 2021-04-19 NOTE — Telephone Encounter (Signed)
Spoke with the patient and she stated she called the health department and they do not have any openings available prior to her trip on 12/19.  States PCP told her she could have vaccines given here.  Message sent to PCP.

## 2021-04-20 ENCOUNTER — Ambulatory Visit (INDEPENDENT_AMBULATORY_CARE_PROVIDER_SITE_OTHER): Payer: BLUE CROSS/BLUE SHIELD | Admitting: *Deleted

## 2021-04-20 DIAGNOSIS — Z23 Encounter for immunization: Secondary | ICD-10-CM | POA: Diagnosis not present

## 2021-04-20 NOTE — Telephone Encounter (Signed)
Spoke with the patient and informed her of the message below. Patient stated she does not have a list of any vaccines she had other than the ones we have listed in her chart.  Stated she already spoke with the health department and they do not have any openings available prior to the date she is leaving.  Patient stated she only wants the required vaccines, declined Hepatitis A here and the nurses visit appt was cancelled.  Patient requested the Rx for malaria be sent to [facility] and the message was forwarded to PCP.

## 2021-04-20 NOTE — Telephone Encounter (Signed)
Patient returned call stating she spoke with Dr. Ethlyn Gallery and was told the office has all the vaccines the patient needs when asked for specifics patient could not give to me because she is at work and left the list at home

## 2021-04-20 NOTE — Progress Notes (Signed)
Sarah Humphrey is a 56 y.o. female presents to the office today for Hep A vaccine per physician's orders. Original order: Hepatitis A (med), 1 ml (dose),  IM (route) was administered Left deltoid (location) today. Patient tolerated injection. Patient due for follow up labs/provider appt: No. Date due: , appt made No Patient next injection due: 2-6 months, appt made No.  Patient is going out of the coutry.  Westley Hummer

## 2021-04-20 NOTE — Telephone Encounter (Signed)
LVM requesting pt to call back to inform us of the specific vaccines that she is requesting.

## 2021-04-20 NOTE — Telephone Encounter (Signed)
In order to help her, I need to know what immunizations she has already had. She thought that she had some before, but was going to check records. She is leaving in 12 days, which really isn't enough time at this point to be considered immunized from a shot given today, so here are the big ticket items.   *yellow fever: we don't have this vaccine, which is why I recommended health department. They may require her to have proof of vaccination upon coming into the country and has to be done at least 10 days prior to her trip. If she doesn't have documentation/vaccine card in past with this, she should go to the [facility] website to search for other locations to get vaccinated (health department may be able to tell her).  *typhoid: we don't have this. Recommended, not required.   *hepatitis A is recommended, not required. We do have this here and she could get first dose if desired.   *malaria prophylaxis suggested for travel. I can prescribe this for her if desired. [place] for nurse visit for hep A if desired. That is really only travel vaccine that we can immediately assist with but if she would like I can prescribe malaria prophylaxis.

## 2021-04-22 ENCOUNTER — Other Ambulatory Visit: Payer: Self-pay | Admitting: Family Medicine

## 2021-04-22 MED ORDER — ATOVAQUONE-PROGUANIL HCL 62.5-25 MG PO TABS
ORAL_TABLET | ORAL | 0 refills | Status: AC
Start: 2021-04-22 — End: ?

## 2021-04-22 NOTE — Telephone Encounter (Signed)
Ok. Atovaquone/proguanil was sent in to [facility] for her. Directions for this were written on her rx. She needs to take as directed, but there are some side effects - nausea being one, but generally well tolerated and has shortest time she has to take (others all have to be taken 4 weeks after she returns). I sent in 30 tablets for her; if she is traveling for more than 3 weeks let me know because I will need to send more.

## 2021-04-25 NOTE — Telephone Encounter (Signed)
Left a detailed message with the information below at the patient's cell number. ?

## 2021-04-26 ENCOUNTER — Other Ambulatory Visit: Payer: Self-pay | Admitting: Family Medicine

## 2021-04-26 DIAGNOSIS — E1159 Type 2 diabetes mellitus with other circulatory complications: Secondary | ICD-10-CM

## 2021-04-28 NOTE — Progress Notes (Signed)
This encounter was created in error - please disregard.

## 2021-06-09 DIAGNOSIS — L820 Inflamed seborrheic keratosis: Secondary | ICD-10-CM | POA: Diagnosis not present

## 2021-06-09 DIAGNOSIS — L700 Acne vulgaris: Secondary | ICD-10-CM | POA: Diagnosis not present

## 2021-06-09 DIAGNOSIS — L658 Other specified nonscarring hair loss: Secondary | ICD-10-CM | POA: Diagnosis not present

## 2021-06-09 DIAGNOSIS — L669 Cicatricial alopecia, unspecified: Secondary | ICD-10-CM | POA: Diagnosis not present

## 2021-06-09 DIAGNOSIS — L089 Local infection of the skin and subcutaneous tissue, unspecified: Secondary | ICD-10-CM | POA: Diagnosis not present

## 2021-06-09 NOTE — Progress Notes (Signed)
SUBJECTIVE: Yvonne Yvonne Webb presents for hair loss over past couple years.   Has sew in weave currently. Feels like edges improved after last visit, also top of scalp but is slower. Having some increased facial hair growth.  Some breakouts on face.       Current Outpatient Medications on File Prior to Visit   Medication Sig Dispense Refill   . amLODIPine (NORVASC) 5 MG tablet Take 1 tablet (5 mg total) by mouth daily.     . bisoprolol-hydrochlorothiazide (ZIAC) 5-6.25 mg per tablet   0   . ergocalciferol, vitamin D2, (VITAMIN D ORAL) Take by mouth daily.     . ferrous sulfate 325 (65 FE) MG EC tablet Take 1 tablet (325 mg total) by mouth daily with breakfast.     . fluocinonide (LIDEX) 0.05 % external solution APPLY ONCE OR TWICE DAILY AS NEEDED  **DO NOT APPLY TO FACE, ARMPIT, OR GROIN** 60 mL 3   . losartan (COZAAR) 50 MG tablet   0   . losartan-hydrochlorothiazide (HYZAAR) 100-25 mg per tablet TAKE 1 TABLET BY MOUTH ONCE DAILY . APPOINTMENT REQUIRED FOR FUTURE REFILLS     . minoxidiL (LONITEN) 2.5 MG tablet Take 1 tablet by mouth once daily 30 tablet 1   . rosuvastatin (CRESTOR) 5 MG tablet Take 1 tablet (5 mg total) by mouth daily.       No current facility-administered medications on file prior to visit.         OBJECTIVE:   Vitals:    06/09/21 1507   BP: 144/87   Pulse: 81   Height: 1.727 m (5' 7.99")   Weight: 82.6 kg (182 lb)   BMI (Calculated): 27.7       Patient is a well nourished 56 y.o. female in no apparent distress with appropriate mood and affect. Exam today focused on the skin and included exam and palpation of the following:   - Face   - eyelids and conjunctivae   - Mouth including lips and teeth, gums   - Scalp   - Neck   - Chest   - Bilateral upper extremities  Exam findings included: thinning of edges and top of scalp  Small ISK under Yvonne Webb eyelid  Few scattered inflammatory papules on cheeks    ASSESSMENT:  Skin inflammation  Traction alopecia and CCSA SASI 1B  Mild acne inflammamtory  ISK under  eye    PLAN:   - The above diagnoses and treatment options were discussed with the patient today.  Injected kenalog 10 mg/cc for total of 1.5 cc into vertex scalp and edges  Lidex solution to apply nightly  Minoxidil 2.5 mg daily  Tretinoin 0.025% for acne  After verbal consent was obtained, the patient underwent destruction via cryosurgery to 1 lesions as noted above. Tolerated the procedure well, wound care was discussed.     No orders of the defined types were placed in this encounter.    No orders of the defined types were placed in this encounter.      - The following labs and/or outside records were reviewed and notable for: n/a  - The patient was offered the opportunity to enroll in [facility] for portal access to their medical records, if not currently enrolled. Patient was encouraged to call with any questions.   No follow-ups on file.      Yvonne Left, MD               Electronically signed by: Harrold Donath  Strowd, MD  06/09/21 [phone]

## 2021-06-29 ENCOUNTER — Encounter: Payer: Self-pay | Admitting: Family Medicine

## 2021-06-29 ENCOUNTER — Ambulatory Visit (INDEPENDENT_AMBULATORY_CARE_PROVIDER_SITE_OTHER): Payer: BC Managed Care – PPO | Admitting: Family Medicine

## 2021-06-29 VITALS — BP 100/68 | HR 81 | Temp 98.0°F | Ht 67.75 in | Wt 191.7 lb

## 2021-06-29 DIAGNOSIS — Z23 Encounter for immunization: Secondary | ICD-10-CM

## 2021-06-29 DIAGNOSIS — E785 Hyperlipidemia, unspecified: Secondary | ICD-10-CM | POA: Diagnosis not present

## 2021-06-29 DIAGNOSIS — M7061 Trochanteric bursitis, right hip: Secondary | ICD-10-CM | POA: Diagnosis not present

## 2021-06-29 DIAGNOSIS — E119 Type 2 diabetes mellitus without complications: Secondary | ICD-10-CM

## 2021-06-29 DIAGNOSIS — E1169 Type 2 diabetes mellitus with other specified complication: Secondary | ICD-10-CM | POA: Diagnosis not present

## 2021-06-29 DIAGNOSIS — E876 Hypokalemia: Secondary | ICD-10-CM | POA: Diagnosis not present

## 2021-06-29 DIAGNOSIS — M7062 Trochanteric bursitis, left hip: Secondary | ICD-10-CM | POA: Diagnosis not present

## 2021-06-29 DIAGNOSIS — R739 Hyperglycemia, unspecified: Secondary | ICD-10-CM

## 2021-06-29 NOTE — Patient Instructions (Addendum)
Hold the crestor for 2 weeks while working on icing those hips and stretching. Let me know how you feel after those two weeks.   Check your blood pressure daily and send me the numbers in 2 weeks. We may be able to decrease meds if home numbers are good.   (Check amlodipine dose - in my records you have 5mg  daily that we have had you on, but if you are taking 2 of these, let me know).

## 2021-06-29 NOTE — Progress Notes (Signed)
Sarah Humphrey DOB: June 29, 1965 Encounter date: 06/29/2021  This is a 56 y.o. female who presents with Chief Complaint  Patient presents with   Follow-up    History of present illness: Was asked to follow-up for elevated A1c of 7.1 at her last visit on 03/23/2021.  Doing ok with eating. Still doing veggies/fish. Occasional rice, careful with portions. Feeling great.  She has been working on [employer].  She really wishes to avoid adding medications.  Back of right leg hurts with prolonged sitting at work. Having pain in hips when she lays down at night. Can't stay on one side too long due to pain. Started 2-3 months ago. Pain does improve with aleve, but tries not to take this.   HL: started crestor and felt this was ok. Not sure if related to pain in joints.   Has noted dry mouth with crestor.    Allergies  Allergen Reactions   Chloroquine Itching   Current Meds  Medication Sig   amLODipine (NORVASC) 5 MG tablet Take 1 tablet by mouth once daily   Atovaquone-Proguanil HCl 62.5-25 MG tablet Take 1 tablet PO daily for 2 days prior to travel to [place] and continue until 7 days after arriving home.   losartan-hydrochlorothiazide (HYZAAR) 100-25 MG tablet TAKE 1 TABLET BY MOUTH ONCE DAILY . APPOINTMENT REQUIRED FOR FUTURE REFILLS   potassium chloride (KLOR-CON) 10 MEQ tablet TAKE 1  BY MOUTH THREE TIMES A WEEK   rosuvastatin (CRESTOR) 5 MG tablet Take 1 tablet (5 mg total) by mouth daily.    Review of Systems  Constitutional:  Negative for chills, fatigue and fever.  Respiratory:  Negative for cough, chest tightness, shortness of breath and wheezing.   Cardiovascular:  Negative for chest pain, palpitations and leg swelling.  Musculoskeletal:  Positive for arthralgias (hips).   Objective:  BP 100/68 (BP Location: Left Arm, Patient Position: Sitting, Cuff Size: Large)    Pulse 81    Temp 98 F (36.7 C) (Oral)    Ht 5' 7.75" (1.721 m)    Wt 191 lb 11.2 oz (87 kg)    SpO2  98%    BMI 29.36 kg/m   Weight: 191 lb 11.2 oz (87 kg)   BP Readings from Last 3 Encounters:  06/29/21 100/68  03/23/21 118/82  09/22/20 140/80   Wt Readings from Last 3 Encounters:  06/29/21 191 lb 11.2 oz (87 kg)  03/23/21 194 lb 11.2 oz (88.3 kg)  09/22/20 187 lb 3.2 oz (84.9 kg)    Physical Exam Constitutional:      General: She is not in acute distress.    Appearance: She is well-developed.  Cardiovascular:     Rate and Rhythm: Normal rate and regular rhythm.     Heart sounds: Normal heart sounds. No murmur heard.   No friction rub.  Pulmonary:     Effort: Pulmonary effort is normal. No respiratory distress.     Breath sounds: Normal breath sounds. No wheezing or rales.  Musculoskeletal:     Right lower leg: No edema.     Left lower leg: No edema.     Comments: Normal range of motion of hips.  She has slight discomfort posterior hip with internal rotation, but main source of discomfort is elicited with palpation of the trochanteric bursae bilateral.  Additionally she has IT band tenderness bilaterally.  Neurological:     Mental Status: She is alert and oriented to person, place, and time.  Psychiatric:  Behavior: Behavior normal.    Assessment/Plan  1. Type 2 diabetes mellitus without complication, without long-term current use of insulin ([place]) She has been working on diet control.  We will recheck blood work today.  She wishes to avoid medication if possible. - Hemoglobin A1c; Future - Hemoglobin A1c  2. Hypokalemia - Comprehensive metabolic panel; Future - Comprehensive metabolic panel  3. Hyperlipidemia associated with type 2 diabetes mellitus ([place]) We will recheck lipids to see how she is done on the Crestor.  However, due to some hip pain that started after taking medication ongoing I have her hold it for 2 weeks to see if this makes a difference.  We will have her resume dose after that window to try again. - Lipid panel; Future - Lipid panel  4.  Trochanteric bursitis of both hips Discussed ice, stretching.  Let me know if not improving with this. - Sedimentation rate; Future - CK; Future - CK - Sedimentation rate  5. Need for shingles vaccine - Varicella-zoster vaccine IM (Shingrix)   Return for pending lab or imaging results.    Micheline Rough, MD

## 2021-06-29 NOTE — Progress Notes (Signed)
Yvonne Webb DOB: June 29, 1965 Encounter date: 06/29/2021  This is a 56 y.o. female who presents with Chief Complaint  Patient presents with   Follow-up    History of present illness: Was asked to follow-up for elevated A1c of 7.1 at her last visit on 03/23/2021.  Doing ok with eating. Still doing veggies/fish. Occasional rice, careful with portions. Feeling great.  She has been working on [employer].  She really wishes to avoid adding medications.  Back of right leg hurts with prolonged sitting at work. Having pain in hips when she lays down at night. Can't stay on one side too long due to pain. Started 2-3 months ago. Pain does improve with aleve, but tries not to take this.   HL: started crestor and felt this was ok. Not sure if related to pain in joints.   Has noted dry mouth with crestor.    Allergies  Allergen Reactions   Chloroquine Itching   Current Meds  Medication Sig   amLODipine (NORVASC) 5 MG tablet Take 1 tablet by mouth once daily   Atovaquone-Proguanil HCl 62.5-25 MG tablet Take 1 tablet PO daily for 2 days prior to travel to [place] and continue until 7 days after arriving home.   losartan-hydrochlorothiazide (HYZAAR) 100-25 MG tablet TAKE 1 TABLET BY MOUTH ONCE DAILY . APPOINTMENT REQUIRED FOR FUTURE REFILLS   potassium chloride (KLOR-CON) 10 MEQ tablet TAKE 1  BY MOUTH THREE TIMES A WEEK   rosuvastatin (CRESTOR) 5 MG tablet Take 1 tablet (5 mg total) by mouth daily.    Review of Systems  Constitutional:  Negative for chills, fatigue and fever.  Respiratory:  Negative for cough, chest tightness, shortness of breath and wheezing.   Cardiovascular:  Negative for chest pain, palpitations and leg swelling.  Musculoskeletal:  Positive for arthralgias (hips).   Objective:  BP 100/68 (BP Location: Left Arm, Patient Position: Sitting, Cuff Size: Large)    Pulse 81    Temp 98 F (36.7 C) (Oral)    Ht 5' 7.75" (1.721 m)    Wt 191 lb 11.2 oz (87 kg)    SpO2  98%    BMI 29.36 kg/m   Weight: 191 lb 11.2 oz (87 kg)   BP Readings from Last 3 Encounters:  06/29/21 100/68  03/23/21 118/82  09/22/20 140/80   Wt Readings from Last 3 Encounters:  06/29/21 191 lb 11.2 oz (87 kg)  03/23/21 194 lb 11.2 oz (88.3 kg)  09/22/20 187 lb 3.2 oz (84.9 kg)    Physical Exam Constitutional:      General: She is not in acute distress.    Appearance: She is well-developed.  Cardiovascular:     Rate and Rhythm: Normal rate and regular rhythm.     Heart sounds: Normal heart sounds. No murmur heard.   No friction rub.  Pulmonary:     Effort: Pulmonary effort is normal. No respiratory distress.     Breath sounds: Normal breath sounds. No wheezing or rales.  Musculoskeletal:     Right lower leg: No edema.     Left lower leg: No edema.     Comments: Normal range of motion of hips.  She has slight discomfort posterior hip with internal rotation, but main source of discomfort is elicited with palpation of the trochanteric bursae bilateral.  Additionally she has IT band tenderness bilaterally.  Neurological:     Mental Status: She is alert and oriented to person, place, and time.  Psychiatric:  Behavior: Behavior normal.    Assessment/Plan  1. Type 2 diabetes mellitus without complication, without long-term current use of insulin ([place]) She has been working on diet control.  We will recheck blood work today.  She wishes to avoid medication if possible. - Hemoglobin A1c; Future - Hemoglobin A1c  2. Hypokalemia - Comprehensive metabolic panel; Future - Comprehensive metabolic panel  3. Hyperlipidemia associated with type 2 diabetes mellitus ([place]) We will recheck lipids to see how she is done on the Crestor.  However, due to some hip pain that started after taking medication ongoing I have her hold it for 2 weeks to see if this makes a difference.  We will have her resume dose after that window to try again. - Lipid panel; Future - Lipid panel  4.  Trochanteric bursitis of both hips Discussed ice, stretching.  Let me know if not improving with this. - Sedimentation rate; Future - CK; Future - CK - Sedimentation rate  5. Need for shingles vaccine - Varicella-zoster vaccine IM (Shingrix)   Return for pending lab or imaging results.    Micheline Rough, MD

## 2021-06-30 LAB — COMPREHENSIVE METABOLIC PANEL
ALT: 15 U/L (ref 0–35)
AST: 13 U/L (ref 0–37)
Albumin: 4.3 g/dL (ref 3.5–5.2)
Alkaline Phosphatase: 59 U/L (ref 39–117)
BUN: 14 mg/dL (ref 6–23)
CO2: 38 mEq/L — ABNORMAL HIGH (ref 19–32)
Calcium: 10.1 mg/dL (ref 8.4–10.5)
Chloride: 100 mEq/L (ref 96–112)
Creatinine, Ser: 0.74 mg/dL (ref 0.40–1.20)
GFR: 90.61 mL/min (ref >60.00)
Glucose, Bld: 96 mg/dL (ref 70–99)
Potassium: 3 mEq/L — ABNORMAL LOW (ref 3.5–5.1)
Sodium: 141 mEq/L (ref 135–145)
Total Bilirubin: 0.7 mg/dL (ref 0.2–1.2)
Total Protein: 8.1 g/dL (ref 6.0–8.3)

## 2021-06-30 LAB — HEMOGLOBIN A1C: Hgb A1c MFr Bld: 7.4 % — ABNORMAL HIGH (ref 4.6–6.5)

## 2021-06-30 LAB — CK: Total CK: 150 U/L (ref 7–177)

## 2021-06-30 LAB — LIPID PANEL
Cholesterol: 128 mg/dL (ref 0–200)
HDL: 47.8 mg/dL (ref >39.00)
LDL Cholesterol: 63 mg/dL (ref 0–99)
NonHDL: 79.76
Total CHOL/HDL Ratio: 3
Triglycerides: 86 mg/dL (ref 0.0–149.0)
VLDL: 17.2 mg/dL (ref 0.0–40.0)

## 2021-06-30 LAB — SEDIMENTATION RATE: Sed Rate: 33 mm/hr — ABNORMAL HIGH (ref 0–30)

## 2021-07-06 MED ORDER — METFORMIN HCL 500 MG PO TABS: 500.0000 mg | ORAL_TABLET | Freq: Two times a day (BID) | ORAL | 1 refills | Status: AC

## 2021-07-06 NOTE — Addendum Note (Signed)
Addended by: Agnes Lawrence on: 07/06/2021 02:18 PM   Modules accepted: Orders

## 2021-09-07 ENCOUNTER — Other Ambulatory Visit: Payer: Self-pay | Admitting: Family Medicine

## 2021-10-05 ENCOUNTER — Other Ambulatory Visit: Payer: BC Managed Care – PPO

## 2021-10-25 ENCOUNTER — Other Ambulatory Visit: Payer: Self-pay | Admitting: *Deleted

## 2021-10-25 DIAGNOSIS — I152 Hypertension secondary to endocrine disorders: Secondary | ICD-10-CM

## 2021-10-25 MED ORDER — AMLODIPINE BESYLATE 5 MG PO TABS: 5.0000 mg | ORAL_TABLET | Freq: Every day | ORAL | 1 refills | Status: AC

## 2021-11-29 ENCOUNTER — Other Ambulatory Visit: Payer: Self-pay | Admitting: *Deleted

## 2021-11-30 MED ORDER — ROSUVASTATIN CALCIUM 5 MG PO TABS: 5.0000 mg | ORAL_TABLET | Freq: Every day | ORAL | 0 refills | Status: AC

## 2021-12-02 ENCOUNTER — Other Ambulatory Visit: Payer: Self-pay | Admitting: *Deleted

## 2021-12-02 DIAGNOSIS — E1159 Type 2 diabetes mellitus with other circulatory complications: Secondary | ICD-10-CM

## 2021-12-02 MED ORDER — LOSARTAN POTASSIUM-HCTZ 100-25 MG PO TABS: ORAL_TABLET | ORAL | 0 refills | Status: AC

## 2022-01-24 DIAGNOSIS — E119 Type 2 diabetes mellitus without complications: Secondary | ICD-10-CM | POA: Diagnosis not present

## 2022-01-24 DIAGNOSIS — E663 Overweight: Secondary | ICD-10-CM | POA: Diagnosis not present

## 2022-01-24 DIAGNOSIS — E7849 Other hyperlipidemia: Secondary | ICD-10-CM | POA: Diagnosis not present

## 2022-01-24 DIAGNOSIS — Z6828 Body mass index (BMI) 28.0-28.9, adult: Secondary | ICD-10-CM | POA: Diagnosis not present

## 2022-01-24 DIAGNOSIS — I10 Essential (primary) hypertension: Secondary | ICD-10-CM | POA: Diagnosis not present

## 2022-02-28 DIAGNOSIS — I10 Essential (primary) hypertension: Secondary | ICD-10-CM | POA: Diagnosis not present

## 2022-02-28 DIAGNOSIS — G5713 Meralgia paresthetica, bilateral lower limbs: Secondary | ICD-10-CM | POA: Diagnosis not present

## 2022-04-14 DIAGNOSIS — E663 Overweight: Secondary | ICD-10-CM | POA: Diagnosis not present

## 2022-04-14 DIAGNOSIS — E7849 Other hyperlipidemia: Secondary | ICD-10-CM | POA: Diagnosis not present

## 2022-04-14 DIAGNOSIS — E119 Type 2 diabetes mellitus without complications: Secondary | ICD-10-CM | POA: Diagnosis not present

## 2022-04-14 DIAGNOSIS — Z1389 Encounter for screening for other disorder: Secondary | ICD-10-CM | POA: Diagnosis not present

## 2022-04-14 DIAGNOSIS — I10 Essential (primary) hypertension: Secondary | ICD-10-CM | POA: Diagnosis not present

## 2022-04-14 DIAGNOSIS — Z6829 Body mass index (BMI) 29.0-29.9, adult: Secondary | ICD-10-CM | POA: Diagnosis not present

## 2022-04-14 DIAGNOSIS — Z0000 Encounter for general adult medical examination without abnormal findings: Secondary | ICD-10-CM | POA: Diagnosis not present

## 2022-04-14 DIAGNOSIS — I4510 Unspecified right bundle-branch block: Secondary | ICD-10-CM | POA: Diagnosis not present

## 2022-05-11 DIAGNOSIS — E7849 Other hyperlipidemia: Secondary | ICD-10-CM | POA: Diagnosis not present

## 2022-05-11 DIAGNOSIS — E119 Type 2 diabetes mellitus without complications: Secondary | ICD-10-CM | POA: Diagnosis not present

## 2022-05-11 DIAGNOSIS — E876 Hypokalemia: Secondary | ICD-10-CM | POA: Diagnosis not present

## 2022-07-10 ENCOUNTER — Other Ambulatory Visit: Payer: Self-pay | Admitting: Specialist

## 2022-08-15 ENCOUNTER — Other Ambulatory Visit: Payer: Self-pay | Admitting: Adult Health

## 2022-08-28 ENCOUNTER — Other Ambulatory Visit: Payer: Self-pay | Admitting: Adult Health

## 2022-10-10 ENCOUNTER — Telehealth: Payer: Self-pay

## 2022-10-10 NOTE — Telephone Encounter (Signed)
[facility] New Patient Coordinator Note    Physician/Location Preference:    Location Preference: [facility]    Physician Preference: first available     Referral:    Referring Provider: self     Is Referral required per insurance? No     History:    Personal Hx of Cancer: no     Prior Chemotherapy -   Prior Radiation -   Prior Surgery related to Cancer -     Family Hx of Cancer : no     Biopsy History:    Yes     Imaging History:    Prior Imaging: yes     Type of Imaging: mamm, sono, bx   Location Performed: [facility]    Other:     Are there patient owned records that will be brought to the first appointment?    No   Has the Appointment been scheduled?     Yes pt is scheduled with Dr. Scotty Court on 10/30/22 at 2:45pm at [facility]

## 2022-10-13 ENCOUNTER — Other Ambulatory Visit: Payer: Self-pay

## 2022-10-27 ENCOUNTER — Encounter: Payer: Self-pay | Admitting: Surgery

## 2022-10-27 DIAGNOSIS — Z9189 Other specified personal risk factors, not elsewhere classified: Secondary | ICD-10-CM | POA: Insufficient documentation

## 2022-10-27 DIAGNOSIS — N6091 Unspecified benign mammary dysplasia of right breast: Secondary | ICD-10-CM | POA: Insufficient documentation

## 2022-10-27 DIAGNOSIS — N6092 Unspecified benign mammary dysplasia of left breast: Secondary | ICD-10-CM | POA: Insufficient documentation

## 2022-10-27 NOTE — Procedures (Unsigned)
Procedure Note  Diagnostic Breast Ultrasound    Surgeon: Molli Hazard, MD  Procedure: Unilateral breast diagnostic ultrasound  Indications: Bilateral breast preoperative evaluation to determine surgical localization    Report:  With the patient in the supine position and the arm abducted the target area(s) was/were located and examined. A SonoSite Ultrasound, using a HFL50XP/15-6 breast linear transducer was used.    Breast laterality:  Bilateral  Area scanned: Upper outer quadrants    Findings:      Ultrasound interrogation of the right upper outer quadrant demonstrated a hydromark clip at the site of biopsy proven atypical ductal hyperplasia.   The previously placed Hydro mark clip is visualized at 11:00, 8 cm from the nipple.     Ultrasound interrogation of the left upper outer quadrant demonstrated a hydromark clip at the site of biopsy proven atypical ductal hyperplasia.   The previously placed Hydro mark clip is visualized at 1:00, 8 cm from the nipple    Impression/Plan:   Sonographic hydro Mark clips are identified by ultrasound.        Pictures were taken.This was discussed with the patient at length.                      Molli Hazard, MD  Breast Surgical Oncology  [facility]  T [phone]  F [phone]  [facility] Location:   [address]  [place], [place] [address]

## 2022-10-27 NOTE — Progress Notes (Unsigned)
[facility]  [address] [place]  [phone]    Breast Surgical Oncology Consultation Report      Chief complaint :New diagnosis of atypical ductal  hyperplasia.    Radiology Facility: [facility])     History of Present Illness:     Ms Yvonne Webb is a 57 y.o. female patient who presents for evaluation and management of atypical ductal  hyperplasia.     Her atypical ductal hyperplasia was diagnosed on screening mammogram.     Clinically, the patient is asymptomatic.  She denies feeling any palpable masses, areas of thickening, skin changes or nipple discharge.  ***    She denies prior history of breast biopsies, breast surgery, cyst aspirations or mastitis.  Other positive or negative symptoms are listed below.    Family History: No family history of breast or ovarian cancer ***    Other relevant past medical history includes: ***      The patient underwent the following workup.  The results and reports were reviewed and scanned into Epic.    07/12/2022- Screening Mammogram:       08/16/2022- Diagnostic Mammogram:        08/28/2022- Stereotactic Biopsy  - Location: Right breast, upper outer quadrant calcifications  - Clip: Butterfly Hydromark  - Post Biopsy Imaging: Clip is at the biopsy site    Pathology: Atypical ductal hyperplasia with calcifications   -Sclerosing adenosis, columnar cell change, cysts, apocrine metaplasia and scattered calcifications in a background of focal pseudoangiomatous stromal hyperplasia (PASH)  Size: Not stated    08/28/2022- Stereotactic Biopsy  - Location: Left breast, upper outer quadrant calcifications  - Clip: Open Coil Hydromark  - Post Biopsy Imaging: Clip at superior margin of biopsy site    Pathology: Atypical ductal hyperplasia with calcifications, no nodular stromal sclerosis (fibrosis)  Size: Not stated                        Review of Systems  A comprehensive review of systems was performed and was otherwise negative  except for: ***    Breast Oncology ROS         Medications:  Current medications  No current outpatient medications on file.     Allergies:  Not on File     Past Medical History:  No past medical history on file.     Past Surgical History:  No past surgical history on file.     GYN History:    Gynecological History   No existing history information found. No existing history information found.   No existing history information found. No existing history information found.   No existing history information found. No existing history information found.   No existing history information found. No existing history information found.   No existing history information found. No existing history information found.   No existing history information found. No existing history information found.   No existing history information found. No existing history information found.   No existing history information found. No existing history information found.         Past Family History:  [place]'s family history is not on file. .    Past Social History:   [place]   Social History     Socioeconomic History    Marital status: Unknown     Spouse name: Not on file    Number of children: Not on file    Years of  education: Not on file    Highest education level: Not on file   Occupational History    Not on file   Tobacco Use    Smoking status: Not on file    Smokeless tobacco: Not on file   Substance and Sexual Activity    Alcohol use: Not on file    Drug use: Not on file    Sexual activity: Not on file   Other Topics Concern    Not on file   Social History Narrative    Not on file     Social Determinants of Health     Financial Resource Strain: Not on file   Food Insecurity: Not on file   Transportation Needs: Not on file   Physical Activity: Not on file   Stress: Not on file   Social Connections: Not on file   Intimate Partner Violence: Not on file   Housing Stability: Not on file   .  History was reviewed with patient and/or family.        Physical Exam: 10/27/2022  There were no vitals taken for this visit.    Constitutional: Well-appearing patient in no apparent distress  Head: Normocephalic and atraumatic.   Eyes: No scleral icterus.   Neck: Normal range of motion. Neck supple. No tracheal deviation or thyromegaly present.   Cardiovascular: Regular rate and rhythm, no murmurs.   Pulmonary/Chest: Clear to auscultation bilaterally. Effort normal.   Abdomen: Non-distended  Musculoskeletal: Good shoulder range of motion;  Spine curvature is normal and is non tender  Neurologic: Neurologically grossly intact  Skin: No obvious skin abnormalities  Lymphatic: No evidence of arm swelling  Psychiatric: pleasant/cooperative and appropriate    Physical Exam     Breast Exam: She is examined in both the upright and the supine positions.   Both nipples are everted.  Bra Size ***.  Ptosis Grade ***.   There are no secondary signs of malignancy on visual inspection of either breast.  There is no cervical, supraclavicular, or infraclavicular lymphadenopathy      RIGHT Breast: No palpable masses, biopsy site changes, HydroMARK clip visualized at ***,  no nipple inversion/retraction/discharge  RIGHT Axilla: No axillary lymphadenopathy     LEFT Breast: No palpable masses, biopsy site changes, HydroMARK clip visualized at ***,  no nipple inversion/retraction/discharge  LEFT Axilla: No axillary lymphadenopathy adenopathy.              Assessment/Plan   1. Atypical ductal hyperplasia of right breast    2. Atypical ductal hyperplasia of left breast        Yvonne Webb is a 57 y.o. patient with bilateral breast Atypical Ductal Hyperplasia.       Treatment Plan  Additional Imaging Needed: I have ordered an MRI to further assess both the breasts and lymph nodes and ensure there are not any other suspicious areas in either breast that require biopsy prior to surgery.   She has additional areas of calcifications in both breasts.  As long as there is no enhancement at  these additional sites of calcifications, we can follow-up with a 45-month bilateral diagnostic mammogram in October 2024.   Surgical Plan: Bilateral partial mastectomy with ultrasound-guided wire localization  Pre-operative risk optimization: ***        Discussion  I had a discussion with the patient about the natural history of atypical ductal hyperplasia (ADH) using diagrams, which have been scanned into the patient's medical record.  ADH is a proliferative pattern of growth  of cells but it is not a pre-cancer lesion but rather a marker which is linked to an increased risk of developing breast cancer in the future.  The estimated rate of atypical ductal hyperplasia diagnosis on core needle biopsy has been reported at 3% in current literature with approximately 10-30% of cases upgraded to breast cancer. In agreement with national guidelines from the American Society of Breast Surgeons, I recommend that ADH as seen on her breast core needle biopsy merits surgical excision to evaluate for possible concurrent cancer.    As such, I would recommend a bilateral Breast Partial Mastectomy (lumpectomy) with Intraoperative Ultrasound-guided Wire Localization.  I have explained the procedure, risks and benefits .  Potential complications include but are not limited to bleeding, infection, hematoma, chronic pain, chronic seroma, dissatisfaction with appearance of the breast or incision, recurrence of the mass, and need for additional surgery.  Most patients, however, experience only mild swelling , ecchymosis, or pain.      The surgery is an outpatient surgery.  You are discharged home the same day.  Pain is usually minimal.  I recommend the use of a supportive brassiere, and the use of schedule Tylenol and a nonsteroidal anti-inflammatory agent such as ibuprofen or naproxen for 5 days.  A narcotic prescription for breakthrough pain can be prescribed.  You are expected to return to normal activity 2 weeks after surgery.       Even if her surgical biopsy remains benign, ADH carries an elevated lifetime risk of breast cancer. Post-operatively we will discuss strategies for enhanced surveillance and lifestyle and medical risk reduction.      This encounter included time spent preparing to see the patient, review of the past medical history, reviewing imaging, including mammogram and ultrasound, review of pathology, reviewing other providers notes, face to face time with the patient, ***performing bedside ultrasound examination, discussing treatment and surgical options, ordering tests, and documentation in the patient record.  I have independently reviewed the above imaging and agree with the findings.             Molli Hazard, MD  Breast Surgical Oncology  [facility]  T [phone]  F [phone]  [facility] Location:   [address]  [place], [place] [address]

## 2022-10-30 ENCOUNTER — Encounter: Payer: Self-pay | Admitting: Surgery

## 2022-10-30 ENCOUNTER — Telehealth: Payer: Self-pay

## 2022-10-30 ENCOUNTER — Ambulatory Visit: Payer: Commercial Managed Care - HMO | Attending: Surgery | Admitting: Surgery

## 2022-10-30 ENCOUNTER — Other Ambulatory Visit (INDEPENDENT_AMBULATORY_CARE_PROVIDER_SITE_OTHER): Payer: Self-pay

## 2022-10-30 VITALS — BP 132/85 | HR 69 | Resp 18 | Ht 68.0 in | Wt 202.0 lb

## 2022-10-30 DIAGNOSIS — N6091 Unspecified benign mammary dysplasia of right breast: Secondary | ICD-10-CM

## 2022-10-30 DIAGNOSIS — Z9189 Other specified personal risk factors, not elsewhere classified: Secondary | ICD-10-CM

## 2022-10-30 DIAGNOSIS — N6092 Unspecified benign mammary dysplasia of left breast: Secondary | ICD-10-CM

## 2022-10-30 NOTE — Telephone Encounter (Signed)
Met with patient after MD appt to provide her with pre/post op teaching.  Also provided her with contact info for [facility] and encouraged her to call [facility] for MRI appt and then let us know the date so that surgery can be coordinated.  Provided assistance with activating pt's mychart account.  Email sent with link to activate account.

## 2022-11-09 ENCOUNTER — Telehealth: Payer: Self-pay | Admitting: Surgery

## 2022-11-09 NOTE — Telephone Encounter (Signed)
Patient MRI is scheduled for 11/21/22.    Spoke with pt to schedule surgery. Pt agreed with the surgery date, time & location. Scheduled pt post op appt. I informed the pt that I will send over surgery instructions to her email. I have posted Dr.Stafford part in epic. Surgery instructions sent.

## 2022-11-13 IMAGING — MG MM DIGITAL SCREENING BILAT W/ TOMO AND CAD
6 of 10 series · 6 of 30 positions shown · non-contrast
Comparison: Previous exam(s).

CLINICAL DATA: Screening.

EXAM:
DIGITAL SCREENING BILATERAL MAMMOGRAM WITH TOMOSYNTHESIS AND CAD
TECHNIQUE: Bilateral screening digital craniocaudal and mediolateral oblique
mammograms were obtained. Bilateral screening digital breast
tomosynthesis was performed. The images were evaluated with
computer-aided detection.

[L MLO synth-2D]
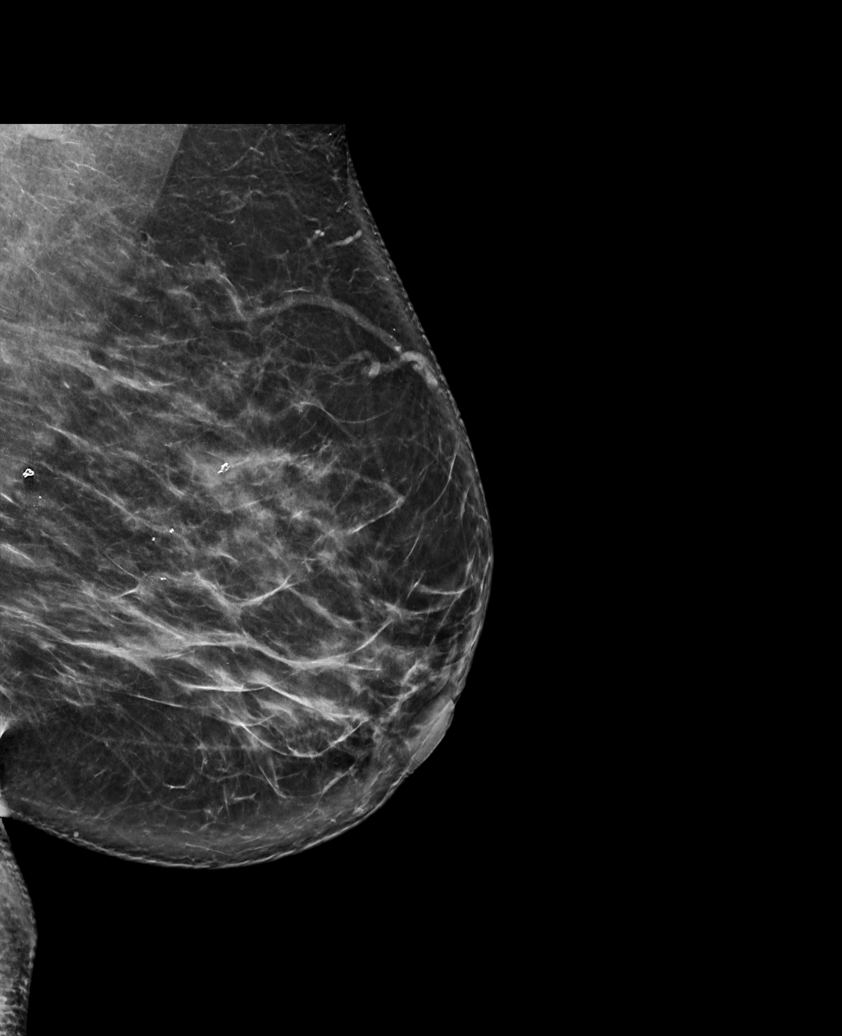

[R XCCL synth-2D]
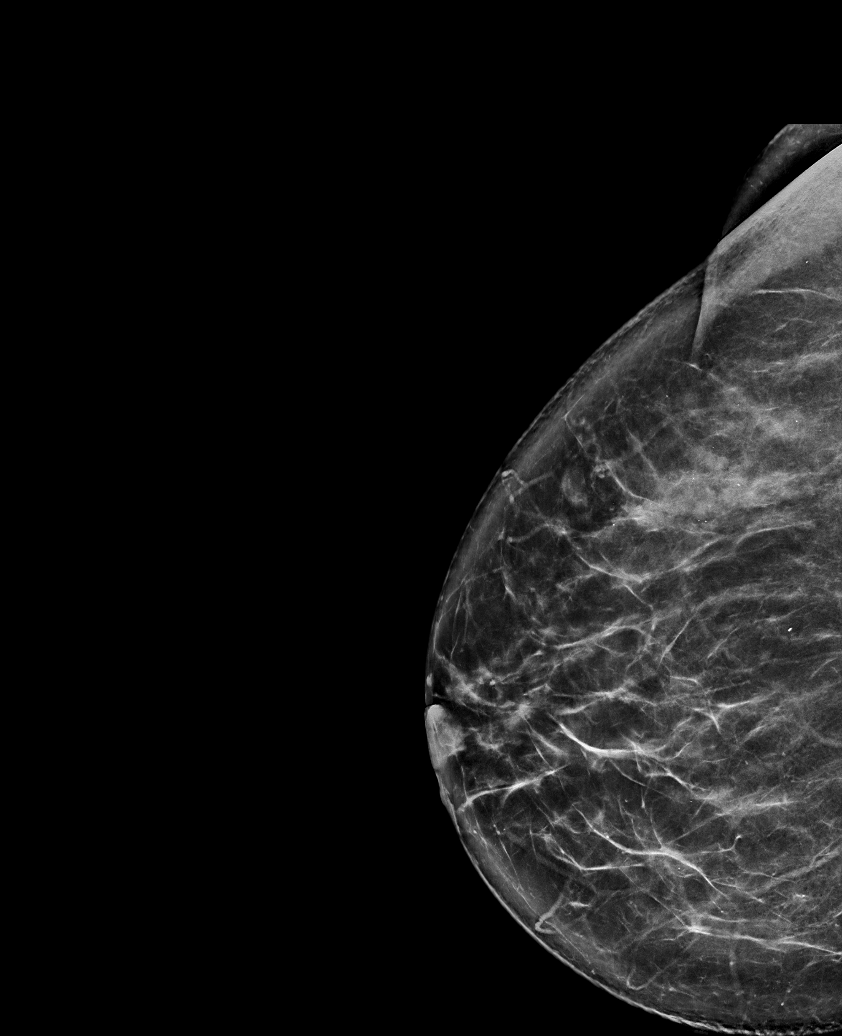

[R CC synth-2D]
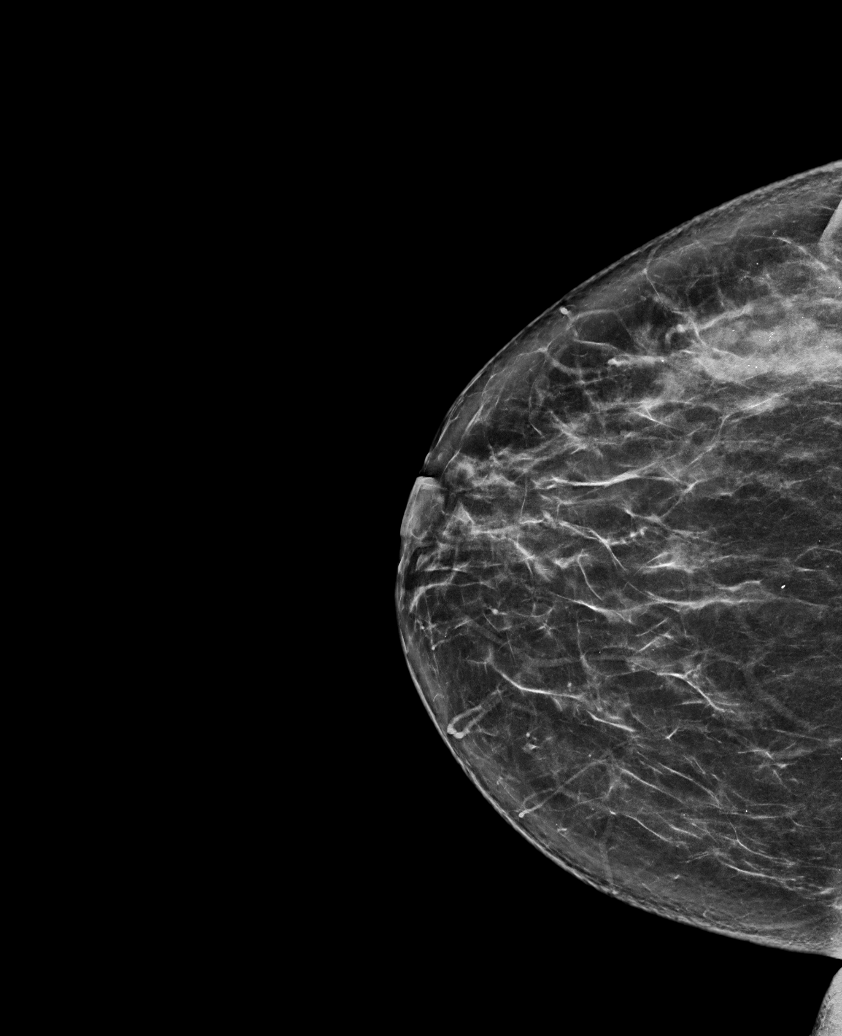

[L CC synth-2D]
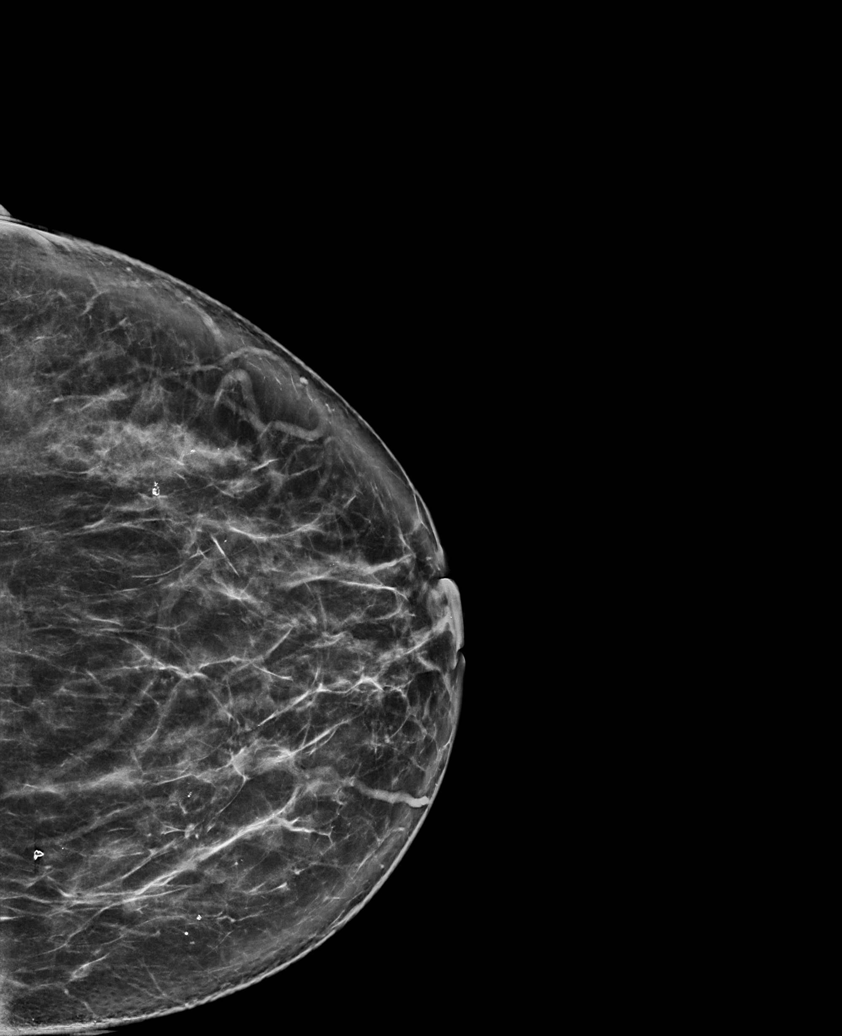

[R MLO synth-2D]
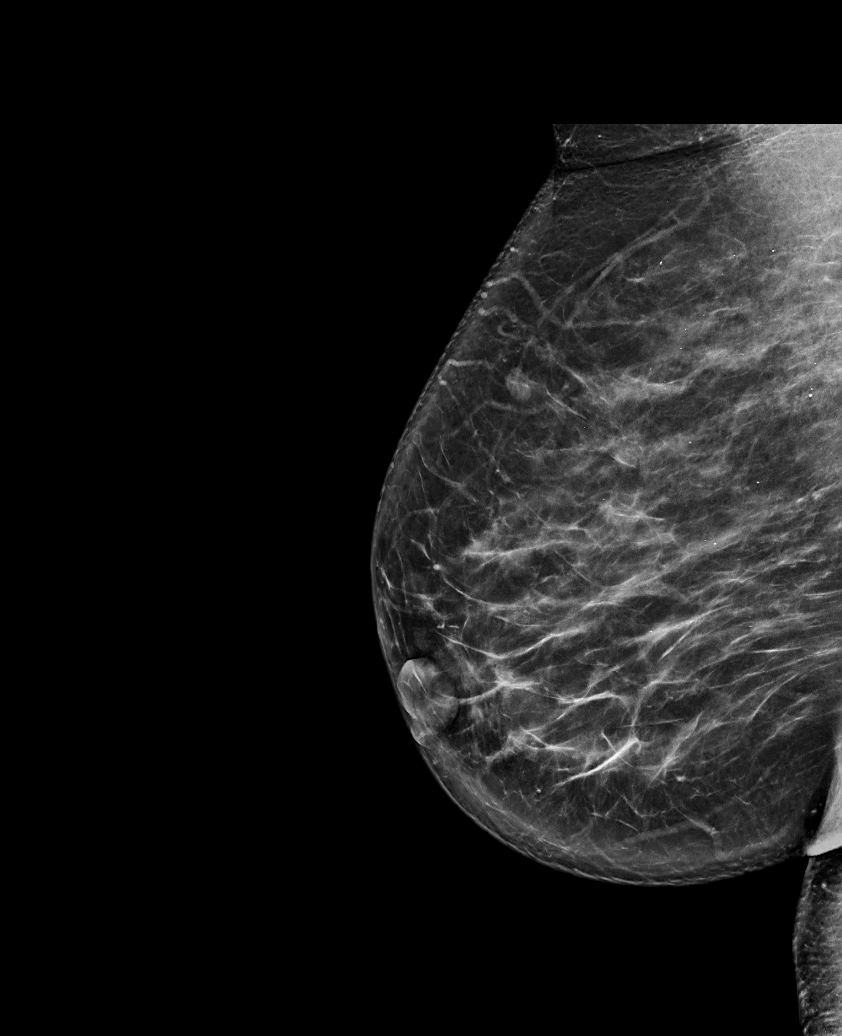

[R MLO tomo · tomo slice 47/93.0]
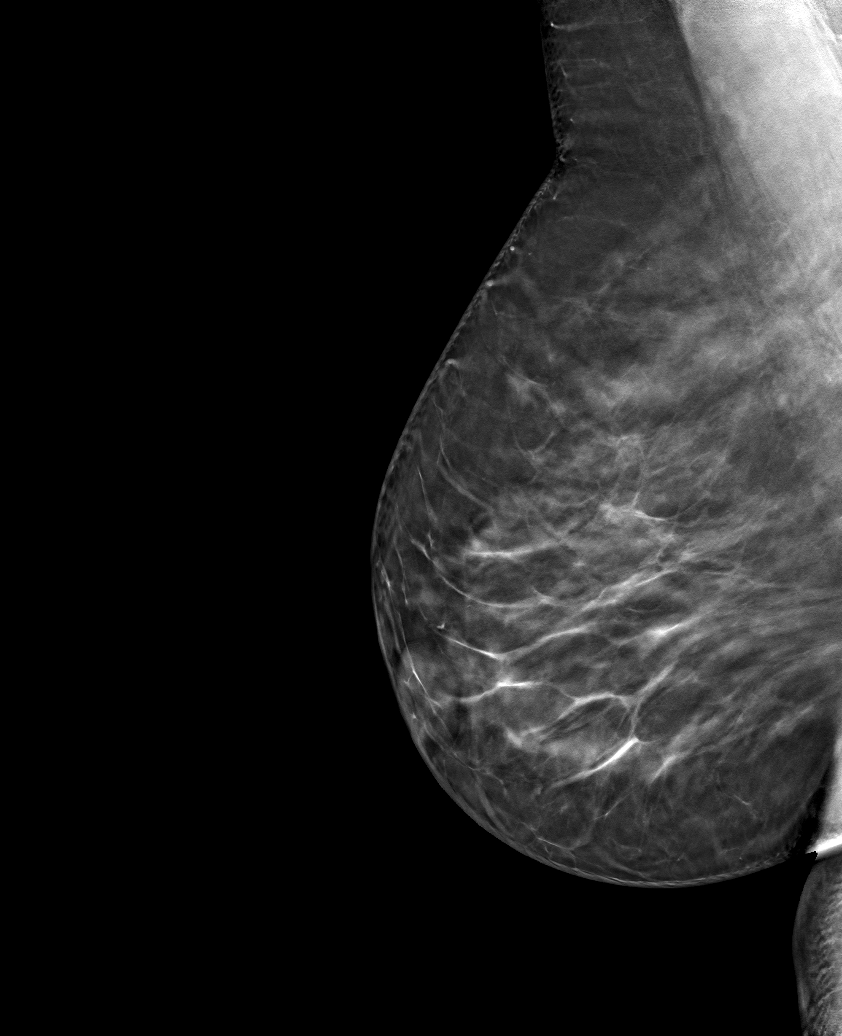

[6 of 30 positions shown; findings below may reference images not displayed]

ACR Breast Density Category c: The breast tissue is heterogeneously
dense, which may obscure small masses.
FINDINGS: There are no findings suspicious for malignancy.
IMPRESSION: No mammographic evidence of malignancy. A result letter of this
screening mammogram will be mailed directly to the patient.

RECOMMENDATION:
Screening mammogram in one year. (Code:Q3-W-BC3)

BI-RADS CATEGORY  1: Negative.

## 2022-11-21 ENCOUNTER — Ambulatory Visit
Admission: RE | Admit: 2022-11-21 | Discharge: 2022-11-21 | Disposition: A | Discharge status: Home / Self Care | Payer: Commercial Managed Care - HMO | Source: Ambulatory Visit | Attending: Surgery | Admitting: Surgery

## 2022-11-21 ENCOUNTER — Other Ambulatory Visit: Payer: Self-pay | Admitting: Surgery

## 2022-11-21 DIAGNOSIS — N6092 Unspecified benign mammary dysplasia of left breast: Secondary | ICD-10-CM | POA: Insufficient documentation

## 2022-11-21 DIAGNOSIS — N6091 Unspecified benign mammary dysplasia of right breast: Secondary | ICD-10-CM | POA: Insufficient documentation

## 2022-11-21 DIAGNOSIS — Z9189 Other specified personal risk factors, not elsewhere classified: Secondary | ICD-10-CM | POA: Insufficient documentation

## 2022-11-21 MED ORDER — GADOBUTROL 1 MMOL/ML IV SOSY (WRAP)
9.0000 mL | Freq: Once | INTRAVENOUS | Status: AC | PRN
Start: 2022-11-21 — End: 2022-11-21
  Administered 2022-11-21: 9 mL via INTRAVENOUS
  Filled 2022-11-21: qty 10

## 2022-11-22 ENCOUNTER — Encounter (INDEPENDENT_AMBULATORY_CARE_PROVIDER_SITE_OTHER): Payer: Commercial Managed Care - HMO

## 2022-11-22 ENCOUNTER — Encounter (INDEPENDENT_AMBULATORY_CARE_PROVIDER_SITE_OTHER): Payer: Self-pay

## 2022-11-23 ENCOUNTER — Telehealth: Payer: Self-pay

## 2022-11-23 NOTE — Telephone Encounter (Signed)
Spoke with patient and reviewed her MRI results as listed below with no additional areas of concerns, will proceed with surgery as planned.  Patient agreeable with the plan. All questions answered.  Surgery sched for 8/1.      FINDINGS:  Amount of glandular tissue: There is heterogenous fibroglandular tissue.  Background parenchymal enhancement:  Moderate.     There is a biopsy marker in the right breast upper outer quadrant and  there is a biopsy marker in the left breast upper outer quadrant from the  prior biopsies demonstrating ADH without associated suspicious enhancement  near the biopsy markers. There are bilateral intramammary lymph nodes and  scattered foci of enhancement most compatible with background parenchymal  enhancement and a benign etiology. There is no definite suspicious early or  abnormal enhancement bilaterally.     There is no bulky axillary or internal mammary artery adenopathy  bilaterally.     IMPRESSION:         1. Bilateral upper outer quadrant biopsy markers from the known bilateral  ADH without associated suspicious enhancement. Continued management as per  the patient's breast surgeon.        2. No suspicious enhancement bilaterally.     BIRADS CATEGORY 02-BENIGN FINDING

## 2022-12-06 ENCOUNTER — Encounter (INDEPENDENT_AMBULATORY_CARE_PROVIDER_SITE_OTHER): Payer: Self-pay | Admitting: Physician Assistant

## 2022-12-06 ENCOUNTER — Encounter (INDEPENDENT_AMBULATORY_CARE_PROVIDER_SITE_OTHER): Payer: Commercial Managed Care - HMO

## 2022-12-06 ENCOUNTER — Ambulatory Visit (FREE_STANDING_LABORATORY_FACILITY): Payer: Commercial Managed Care - HMO | Admitting: Physician Assistant

## 2022-12-06 ENCOUNTER — Telehealth: Payer: Self-pay

## 2022-12-06 VITALS — BP 134/85 | HR 90 | Ht 68.0 in | Wt 197.0 lb

## 2022-12-06 DIAGNOSIS — Z01818 Encounter for other preprocedural examination: Secondary | ICD-10-CM

## 2022-12-06 DIAGNOSIS — E119 Type 2 diabetes mellitus without complications: Secondary | ICD-10-CM

## 2022-12-06 DIAGNOSIS — N6091 Unspecified benign mammary dysplasia of right breast: Secondary | ICD-10-CM

## 2022-12-06 DIAGNOSIS — I152 Hypertension secondary to endocrine disorders: Secondary | ICD-10-CM

## 2022-12-06 DIAGNOSIS — E876 Hypokalemia: Secondary | ICD-10-CM

## 2022-12-06 DIAGNOSIS — E1159 Type 2 diabetes mellitus with other circulatory complications: Secondary | ICD-10-CM

## 2022-12-06 LAB — PRE-PROCEDURE ANEMIA EVALUATION REFLEX PANEL
Creatinine: 0.8 mg/dL (ref 0.4–1.0)
Ferritin: 166.98 ng/mL (ref 4.60–204.00)
GFR: >60 mL/min/{1.73_m2} (ref >=60.0)
Hemolysis Index: 11 Index
Iron Saturation: 20 % (ref 15–50)
Iron: 56 ug/dL (ref 32–157)
TIBC: 275 ug/dL (ref 265–497)
TSH: 1.5 u[IU]/mL (ref 0.35–4.94)
UIBC: 219 ug/dL (ref 126–382)

## 2022-12-06 LAB — CBC
Absolute nRBC: 0 10*3/uL (ref <=0.00)
Hematocrit: 36.7 % (ref 34.7–43.7)
Hemoglobin: 11.8 g/dL (ref 11.4–14.8)
MCH: 26.5 pg (ref 25.1–33.5)
MCHC: 32.2 g/dL (ref 31.5–35.8)
MCV: 82.3 fL (ref 78.0–96.0)
MPV: 10.7 fL (ref 8.9–12.5)
Platelet Count: 214 10*3/uL (ref 142–346)
RBC: 4.46 10*6/uL (ref 3.90–5.10)
RDW: 15 % (ref 11–15)
WBC: 5.65 10*3/uL (ref 3.10–9.50)
nRBC %: 0 /100 WBC (ref <=0.0)

## 2022-12-06 LAB — BASIC METABOLIC PANEL
Anion Gap: 10 (ref 5.0–15.0)
BUN: 10 mg/dL (ref 7–21)
CO2: 28 mEq/L (ref 17–29)
Calcium: 9.7 mg/dL (ref 8.5–10.5)
Chloride: 103 mEq/L (ref 99–111)
Creatinine: 0.8 mg/dL (ref 0.4–1.0)
GFR: >60 mL/min/{1.73_m2} (ref >=60.0)
Glucose: 182 mg/dL — ABNORMAL HIGH (ref 70–100)
Hemolysis Index: 7 Index
Potassium: 3.2 mEq/L — ABNORMAL LOW (ref 3.5–5.3)
Sodium: 141 mEq/L (ref 135–145)

## 2022-12-06 LAB — HEMOGLOBIN A1C
Average Estimated Glucose: 182.9 mg/dL
Hemoglobin A1C: 8 % — ABNORMAL HIGH (ref 4.6–5.6)

## 2022-12-06 NOTE — PEC In-Person Visit (H&P) (Addendum)
Pre-Anesthesia Evaluation     Pre-op Interview visit requested by:   Reason for pre-op interview visit: Patient anticipating BILATERAL BREAST PARTIAL MASTECTOMY, PLACEMENT BREAST LOCALIZATION DEVICE, ULTRASOUND GUIDED procedure.         History of Present Illness/Summary:  Patient presents to the [facility] clinic for a pre-operative evaluation.    Assessment/Plan:    1.  Encounter for preoperative assessment [Z01.818]    Orders Placed This Encounter   Procedures    Pre-Procedure Anemia Evaluation (Order)    Basic Metabolic Panel    Hemoglobin A1C    CBC without Differential    Gold SST Hold Tube    Pre-Procedure Anemia Evaluation, Reflex Panel    Potassium     Need preferred pharmacy to send script for metformin and potassium replacement. Needs repeat K by 7/31.     Addendum 12/07/22: patients HgA1C came back at 8.0.  I have notified Dr Scotty Court and awaiting response on proceeding. I was going to send a prescription in for patient for metformin, however, there is no preferred pharmacy  - trying to find out where she would like it sent.     --addendum 7/29 - Dr Scotty Court wants to proceed. I have not been able to get in touch with patient to find pharmacy for K replacement and/or metformin script.       Patient not optimized for surgery 2/2 to DM not being controlled with a HgA1C of 8.0, however, if patient and surgeon feel the benefits outweigh the risks and wish to proceed, it is not prohibitive. Surgeon prefers to proceed 2/2 to time that it will take to bring down HgA1C.     2.  Surgical Diagnosis:  Atypical ductal hyperplasia of right breast [N60.91]  Atypical ductal hyperplasia of left breast [N60.92]    3.  HTN - on amlodipine and losartan/HCTZ (Hold DOS)  --BP today: 134/85    4.  T2 DM without long term insulin use - on metformin but ran out and needs new prescription. Patient advised to do this ASAP.   --HgA1C: 8.0%    5. FIDA - H/H: 11.8/36.7 - on PO Iron  --iron labs: Iron Sat: 20%, Total Iron: 56, Ferritin:  166.98  --may benefit from iron infusion. Ordered Ferrlecit 125 mg X 1 for post op.     6. Hypokalemia - 3.2  --trying to find out pharmacy to send prescription for potassium replacement.               Patient voiced understanding of all instructions.  All questions and concerns addressed at this time. This assessment will be conveyed to the surgery and anesthesiology teams & the patient will be evaluated the morning of surgery.      Problem List:  Medical Problems       Hospital Problem List  Date Reviewed: 12/06/2022   None        Non-Hospital Problem List  Date Reviewed: 12/06/2022            ICD-10-CM Priority Class Noted Diagnosed    Atypical ductal hyperplasia of right breast N60.91   10/27/2022     Atypical ductal hyperplasia of left breast N60.92   10/27/2022     At high risk for breast cancer Z91.89   10/27/2022     Hypertension associated with diabetes E11.59, I15.2   01/14/2009     Overview Signed 12/06/2022  2:50 PM by Valetta Mole, PA     Qualifier: Diagnosis of   By: Artist Pais  DO, D. Robert         Type 2 diabetes mellitus without complication, without long-term current use of insulin E11.9   09/01/2015     Low serum potassium E87.6   12/07/2022         Medical History   Diagnosis Date    Anemia     Diabetes mellitus     Hypertension     Type 2 diabetes mellitus, controlled      Past Surgical History:   Procedure Laterality Date    TUBAL LIGATION          Medication List            Accurate as of December 06, 2022 11:59 PM. Always use your most recent med list.                amLODIPine 5 MG tablet  TAKE 1 TABLET BY MOUTH ONCE DAILY FOR 90 DAYS  Commonly known as: NORVASC  Medication Adjustments for Surgery: Take as prescribed     ferrous sulfate 325 (65 FE) MG tablet  Take 1 tablet (325 mg) by mouth every morning with breakfast  Medication Adjustments for Surgery: Hold day of surgery     losartan-hydrochlorothiazide 100-25 MG per tablet  Commonly known as: HYZAAR  Medication Adjustments for Surgery: Hold day of  surgery     metFORMIN 500 MG tablet  Take 1 tablet (500 mg) by mouth 2 (two) times daily  Commonly known as: GLUCOPHAGE  Medication Adjustments for Surgery: Take as prescribed     VITAMIN D PO  Take by mouth  Medication Adjustments for Surgery: Hold day of surgery            Allergies[1]  Social History     Occupational History    Not on file   Tobacco Use    Smoking status: Never     Passive exposure: Never    Smokeless tobacco: Never   Vaping Use    Vaping status: Never Used   Substance and Sexual Activity    Alcohol use: Not Currently    Drug use: Never    Sexual activity: Not on file       Menstrual History:   LMP / Status  Postmenopausal     No LMP recorded. Patient is postmenopausal.    Tubal Ligation?  No valid surgical or medical questions entered.           Exam Scores:   SDB score  OSA Risk Category: No Risk        STBUR score       PONV score  Nausea Risk: VERY SEVERE RISK    MST score  MST Score: 0    PEN-FAST score       Frailty score       MICA  MICA %: 0.04    RCRI score  RCRI Score: 0    DASI  DASI Score: 42.7  METs Level: 7.99    EA-DIVA       CHADsVasc            Visit Vitals  BP 134/85   Pulse 90   Ht 1.727 m (5\' 8" )   Wt 89.4 kg (197 lb)   SpO2 100%   BMI 29.95 kg/m   Overweight based on BMI.    Review of Systems   Constitutional: Negative.    HENT: Negative.     Eyes: Negative.    Respiratory: Negative.     Cardiovascular: Negative.  Gastrointestinal: Negative.    Musculoskeletal: Negative.    Skin: Negative.    Neurological: Negative.    Endo/Heme/Allergies: Negative.    Psychiatric/Behavioral: Negative.         Physical Exam:  Mallampati: IV  TM distance: > 3 FB (> 6 cm)  Mouth opening: > 3 FB (> 6 cm)  Neck extension: full  Upper lip bite assessment: class I        Normal neurological exam  Mental status: alert and oriented x3  Speech: normal    Normal cardiovascular exam  Heart rhythm: regular        Normal pulmonary exam  Breath sounds clear to auscultation bilaterally            Recent  Labs   CBC (last 180 days) 12/06/22  1531   WBC 5.65   RBC 4.46   Hemoglobin 11.8   Hematocrit 36.7   MCV 82.3   MCH 26.5   MCHC 32.2   RDW 15   Platelet Count 214   MPV 10.7   nRBC % 0.0   Absolute nRBC 0.00     Recent Labs   BMP (last 180 days) 12/06/22  1531   Glucose 182*   BUN 10   Creatinine 0.8  0.8   Sodium 141   Potassium 3.2*   Chloride 103   CO2 28   Calcium 9.7   Anion Gap 10.0   GFR >60.0  >60.0         Recent Labs   Other (last 180 days) 12/06/22  1531   TSH 1.50   Hemoglobin A1C 8.0*   Iron 56   Ferritin 166.98   Iron Saturation 20             Anesthesia Plan:  ASA 3   Anesthetic Options Discussed: General  Potential anesthesia/Perioperative problems: No Problems Identified    DNR status: Patient or surrogate requests that DNR status remain full code.  Discussed DNR status with: Patient      A discussion with regarding next steps to prepare for the procedure and the planned anesthesia care took place during today's visit.  I explained that the patient will meet with their anesthesiology providers on the DOS.  Discussed with Patient      Acceptability of blood products: Accepted  Use of blood products discussed with Patient                                                               [1]   Allergies  Allergen Reactions    Chloroquine Itching

## 2022-12-06 NOTE — Telephone Encounter (Signed)
I called the pt to ask her to arrive within the 9am hour for her 08/16 appt with Dr. Scotty Court. I left the pt a vm.

## 2022-12-07 DIAGNOSIS — E876 Hypokalemia: Secondary | ICD-10-CM | POA: Insufficient documentation

## 2022-12-07 LAB — LAB USE ONLY - GOLD SST HOLD TUBE

## 2022-12-10 NOTE — H&P (Signed)
Breast Surgical Oncology History and Physical    Radiology Facility: [facility])      History of Present Illness:      Ms Yvonne Webb is a 57 y.o. female patient who presents for evaluation and management of atypical ductal  hyperplasia.      Her atypical ductal hyperplasia was diagnosed on screening mammogram.      Clinically, the patient is asymptomatic.  She denies feeling any palpable masses, areas of thickening, skin changes or nipple discharge.       She has a history of a benign breast biopsy, but denies other breast surgery, cyst aspirations or mastitis.  Other positive or negative symptoms are listed below.     Family History: No family history of breast or ovarian cancer      Other relevant past medical history includes: DM, HTN        The patient underwent the following workup.  The results and reports were reviewed and scanned into Epic.     07/12/2022- Screening Mammogram:        08/16/2022- Diagnostic Mammogram:          08/28/2022- Stereotactic Biopsy  - Location: Right breast, upper outer quadrant calcifications  - Clip: Butterfly Hydromark  - Post Biopsy Imaging: Clip is at the biopsy site     Pathology: Atypical ductal hyperplasia with calcifications              -Sclerosing adenosis, columnar cell change, cysts, apocrine metaplasia and scattered calcifications in a background of focal pseudoangiomatous stromal hyperplasia (PASH)  Size: Not stated     08/28/2022- Stereotactic Biopsy  - Location: Left breast, upper outer quadrant calcifications  - Clip: Open Coil Hydromark  - Post Biopsy Imaging: Clip at superior margin of biopsy site     Pathology: Atypical ductal hyperplasia with calcifications, no nodular stromal sclerosis (fibrosis)  Size: Not stated                                           ROS: Denies fever/chills, N/V, CP, SOB.    PHYSICAL EXAM:  There were no vitals taken for this visit.    General: NAD  Chest:  Normal respiratory effort, CTAB  Heart:  RRR  Abdomen:   Soft, non-distended, non-tender  Extremities:  MAE  Neuro:  Grossly normal      A/P: Yvonne Webb is a 57 y.o. patient with bilateral breast Atypical Ductal Hyperplasia.   Patient has been evaluated and is deemed safe to proceed forward with Bilateral partial mastectomy with ultrasound-guided wire localization   They have been given ample opportunity to ask appropriate questions and those questions have been answered to their satisfaction.   The risks of the procedure were discussed, including bleeding, infection, seroma, pain, need for future surgery or procedures, poor cosmetic outcome, wound healing complications, and recurrence. All questions were answered to the patients satisfaction. Informed consent was obtained, signed and electronically placed in the chart.         Molli Hazard, MD  Breast Surgical Oncology  [facility]  T [phone]  F [phone]  [facility] Location:   [address]  [place], [place] [address]

## 2022-12-10 NOTE — Discharge Instr - AVS First Page (Addendum)
[facility]   DISCHARGE INSTRUCTIONS AFTER BREAST SURGERY    1. The dressing is water proof and if it looks intact you may shower on the day of surgery. 24 hours after surgery remove the dressing (clear Tegaderm and guaze), but leave the Steri-Strips in place. Steri-strips are strips of white tape used to close the incision. It is okay to shower with these on. Do not pull them off; the will fall off on their own.  2. When you shower, do not scrub the incision. Instead, wash the surrounding areas and allow the soapy water wash over the incision.  You do not need to redress the incision.  3. Wear a comfortable bra at all times, even to bed, this helps to keep swelling down.  4. You may return to your regular diet as you feel able. A healthy well-balanced diet is encouraged and drink lots of fluid.  5. Take pain reliever as directed. Most narcotics are constipating so plan accordingly.    Medications for Pain Control    Take Tylenol (Acetaminophen) 650mg  every 6 hours or 1,000mg  (1 gram) every 8 hours while taking narcotic pain medicine. You should not continue to take Tylenol after your pain has resolved. You should not take more than 4 grams (4,000mg ) of Tylenol in a day.     Take Ibuprofen (Advil) 4-600mg  every 6 hours while taking narcotic pain medicine. You should alternate this with Tylenol, so you are taking one of the medications every 3 hours.     Please check with your physician before using these medications if you have chronic liver or kidney diseases    You can take oxycodone 2.5- 5mg  (1/2-1 pill)every 4-6 hours if your pain is not controlled with Tylenol and Ibuprofen. Start increasing time between taking pills as your pain becomes more tolerable. Continue taking tylenol and ibuprofen as you start decreasing the amount of oxycodone you take.   Narcotic pain medicine (oxycodone) causes constipation, so continue taking your stool softener (Miralax, Colace, etc.) while taking narcotic pain medicine.  You should be having a bowel movement every 2-3 days.  If not, then proceed with over the counter laxatives, enemas, or suppositories to fix the constipation.    6. You may resume your previous medications, unless otherwise instructed by your surgeon or primary care physician.  7. Bruising and mild swelling are common after surgery . Use an ice pack or bag of frozen peas wrapped in a cloth over the incision for no more than 20 minutes at a time. Do this as needed.   8. You may resume driving once you are no longer using the prescribed pain medication. Do not drive if you have a surgical drain.  9. Never drink alcohol or drive a vehicle while taking pain medication.  10. Do not lift more than 10 pounds. It is OK to walk up and down stairs. Keep your activity light until returning for your post-op visit.  11. Pain medications can be constipating. Take an over the counter stool softener as needed and maintain good hydration. Drink at least 6-8 glasses of non-caffeinated beverages a day.  12. You will need to come in for a follow up appointment within 2 weeks after surgery. If you do not already have this appointment scheduled with me, please call the office [phone]) to make an appointment.   13. As soon as your pathology report is signed it will appear in MyChart and if you have alerts set up, you will be notified  that the results are available to view.  Once I see the results, we will contact you to discuss further.       Possible reasons to call the office:  -Fever over 101 F  -Redness around the wound or drainage from the wound.  -Separation of the skin at the incision site  -Pain not relieved by the pain medication  -chest pain or shortness of breath     Molli Hazard, MD  Breast Surgical Oncology  [facility]  T [phone]  F [phone]  [facility] Location:   [address]  [place], [place] [address]  can cause nausea. Eat a little food before taking pain medicine to avoid this.  Constipation is a common side effect of pain medicines. Drinking lots of fluids and eating foods, such as fruits and vegetables, that are high in fiber can also help.   Call your doctor before taking any medicines such as laxatives or stool softeners to help ease constipation. Also, ask if you should avoid any foods. Remember, do not take laxatives unless your surgeon has prescribed them  Drinking alcohol and taking pain medicines can cause dizziness and slow your breathing. It can even be deadly. Do not drink alcohol while taking pain medicines.  Pain medicines can make you react more slowly to things. Do not drive or run machinery while taking pain medicines.    Important facts about Acetaminophen (Tylenol)  Acetaminophen or Tylenol is a common pain reliever.  Check your prescription pain medication labels to see if it contains acetaminophen.  Your health care provider may tell you to take acetaminophen to help ease your pain. Ask him or her how much you should to take each day.   Some prescription pain medicines have acetaminophen and other ingredients. Using both prescription pain medicines and over the counter (OTC) acetaminophen for pain can cause you to overdose.  Max dose of acetaminophen is 4000mg /24 hours for most people. Overdosing on acetaminophen may lead to liver failure.  Read the labels on your (over the counter) medicines  with care. This will help you to clearly know the list of ingredients, how much to take, and any warnings. This will prevent you from taking too much acetaminophen.  If you have questions or do not understand the information, ask your pharmacist or health care provider to explain it to you before you take the OTC medicine.    Managing nausea  Some people have an upset stomach after surgery. This is often because of anesthesia, pain, pain medicines, or the stress of surgery. If you were on a special food plan before surgery, ask your doctor if you should follow it while you get better. The following are tips to help you manage nausea after anesthesia:  Do not push yourself to eat. Your body will tell you when to eat and how much.  Start off with clear liquids and soup. They are easier to digest.  Next try semi-solid foods such as mashed potatoes, applesauce, and gelatin, as you feel ready.  Slowly move to solid foods. Don't eat fatty, rich, or spicy foods at first.  Do not force yourself to have 3 large meals a day. Instead eat smaller amounts more often.  Take pain medicines with a small amount of food, such as crackers or toast, to avoid nausea.     Call your surgeon if:  You have worsening pain an hour after taking pain medicine. The pain medicine may not be strong enough.  You feel too sleepy, dizzy, or groggy. The pain medicine may be too strong.  You have side effects like persistent nausea, vomiting, or skin changes such as rash, itching, or hives.   You have bleeding through your dressing or your dressing becomes saturated.  You have signs of infection such as redness, fever, or increased/foul smelling drainage.      If you have obstructive sleep apnea (OSA)  You were given anesthesia medicine during surgery to keep you comfortable and free of pain. After surgery, you may have more apnea spells because of this medicine and other medicines you were given. The spells may  last longer than usual.   At home:    Keep using the continuous positive airway pressure (CPAP) device when you sleep. Unless your health care provider tells you not to, use it when you sleep,        or take a nap; day or night. CPAP is a common device used to treat OSA.  You are to sleep upright- with HOB > 30 degrees, with pillows; or use a recliner          for the first week or while on opioids or medications that are sedating.  Another adult needs to be with you during first 24 hours home after surgery.  Limit opioid use when possible and use alternative comfort measures.  Talk with your provider before taking any pain medicine, muscle relaxants, or sedatives. Your provider will tell you about the possible dangers of taking these medicines.

## 2022-12-14 ENCOUNTER — Ambulatory Visit
Admission: RE | Admit: 2022-12-14 | Discharge: 2022-12-14 | Disposition: A | Discharge status: Home / Self Care | Payer: Commercial Managed Care - HMO | Source: Ambulatory Visit | Attending: Surgery | Admitting: Surgery

## 2022-12-14 ENCOUNTER — Ambulatory Visit: Payer: Commercial Managed Care - HMO | Admitting: Anesthesiology

## 2022-12-14 ENCOUNTER — Encounter
Admission: RE | Disposition: A | Discharge status: Home / Self Care | Payer: Self-pay | Source: Ambulatory Visit | Attending: Surgery

## 2022-12-14 ENCOUNTER — Ambulatory Visit: Payer: Commercial Managed Care - HMO

## 2022-12-14 ENCOUNTER — Encounter: Payer: Self-pay | Admitting: Surgery

## 2022-12-14 ENCOUNTER — Encounter: Payer: Self-pay | Admitting: Anesthesiology

## 2022-12-14 ENCOUNTER — Ambulatory Visit (HOSPITAL_BASED_OUTPATIENT_CLINIC_OR_DEPARTMENT_OTHER): Payer: Commercial Managed Care - HMO | Admitting: Radiology

## 2022-12-14 DIAGNOSIS — N6081 Other benign mammary dysplasias of right breast: Secondary | ICD-10-CM | POA: Insufficient documentation

## 2022-12-14 DIAGNOSIS — N6489 Other specified disorders of breast: Secondary | ICD-10-CM | POA: Insufficient documentation

## 2022-12-14 DIAGNOSIS — I10 Essential (primary) hypertension: Secondary | ICD-10-CM | POA: Insufficient documentation

## 2022-12-14 DIAGNOSIS — N6031 Fibrosclerosis of right breast: Secondary | ICD-10-CM | POA: Insufficient documentation

## 2022-12-14 DIAGNOSIS — N6032 Fibrosclerosis of left breast: Secondary | ICD-10-CM | POA: Insufficient documentation

## 2022-12-14 DIAGNOSIS — N6091 Unspecified benign mammary dysplasia of right breast: Secondary | ICD-10-CM

## 2022-12-14 DIAGNOSIS — N6092 Unspecified benign mammary dysplasia of left breast: Secondary | ICD-10-CM

## 2022-12-14 DIAGNOSIS — E119 Type 2 diabetes mellitus without complications: Secondary | ICD-10-CM | POA: Insufficient documentation

## 2022-12-14 HISTORY — PX: BREAST, MASTECTOMY, PARTIAL, LUMPECTOMY: SHX3317

## 2022-12-14 HISTORY — PX: PLACEMENT BREAST LOCALIZATION DEVICE, ULTRASOUND GUIDED: SHX00178

## 2022-12-14 LAB — WHOLE BLOOD GLUCOSE POCT
Whole Blood Glucose POCT: 145 mg/dL — ABNORMAL HIGH (ref 70–100)
Whole Blood Glucose POCT: 151 mg/dL — ABNORMAL HIGH (ref 70–100)

## 2022-12-14 SURGERY — MASTECTOMY, PARTIAL
Anesthesia: Anesthesia General | Site: Breast | Laterality: Bilateral | Wound class: Clean

## 2022-12-14 MED ORDER — LIDOCAINE HCL 1 % IJ SOLN
1.0000 mL | Freq: Once | INTRAMUSCULAR | Status: DC | PRN
Start: 2022-12-14 — End: 2022-12-14

## 2022-12-14 MED ORDER — MIDAZOLAM HCL 1 MG/ML IJ SOLN (WRAP)
INTRAMUSCULAR | Status: DC | PRN
Start: 2022-12-14 — End: 2022-12-14
  Administered 2022-12-14: 2 mg via INTRAVENOUS

## 2022-12-14 MED ORDER — ONDANSETRON HCL 4 MG/2ML IJ SOLN
4.0000 mg | Freq: Once | INTRAMUSCULAR | Status: DC | PRN
Start: 2022-12-14 — End: 2022-12-14

## 2022-12-14 MED ORDER — ONDANSETRON HCL 4 MG/2ML IJ SOLN
INTRAMUSCULAR | Status: AC
Start: 2022-12-14 — End: ?
  Filled 2022-12-14: qty 2

## 2022-12-14 MED ORDER — LIDOCAINE 4 % EX CREA
TOPICAL_CREAM | Freq: Once | CUTANEOUS | Status: DC | PRN
Start: 2022-12-14 — End: 2022-12-14

## 2022-12-14 MED ORDER — LIDOCAINE HCL (PF) 2 % IJ SOLN
INTRAMUSCULAR | Status: DC | PRN
Start: 2022-12-14 — End: 2022-12-14
  Administered 2022-12-14: 100 mg via INTRAVENOUS

## 2022-12-14 MED ORDER — OXYCODONE HCL 5 MG PO TABS
2.5000 mg | ORAL_TABLET | Freq: Four times a day (QID) | ORAL | 0 refills | Status: DC | PRN
Start: 2022-12-14 — End: 2022-12-14

## 2022-12-14 MED ORDER — SCOPOLAMINE 1 MG/3DAYS TD PT72
1.0000 | MEDICATED_PATCH | Freq: Once | TRANSDERMAL | Status: DC
Start: 2022-12-14 — End: 2022-12-14

## 2022-12-14 MED ORDER — LIDOCAINE HCL 1 % IJ SOLN
INTRAMUSCULAR | Status: DC | PRN
Start: 2022-12-14 — End: 2022-12-14
  Administered 2022-12-14: 10 mL

## 2022-12-14 MED ORDER — ACETAMINOPHEN 500 MG PO TABS
1000.0000 mg | ORAL_TABLET | Freq: Once | ORAL | Status: AC
Start: 2022-12-14 — End: 2022-12-14

## 2022-12-14 MED ORDER — STERILE WATER FOR INJECTION IJ/IV SOLN (WRAP)
2.0000 g | INTRAMUSCULAR | Status: AC
Start: 2022-12-14 — End: 2022-12-14
  Administered 2022-12-14: 2 g via INTRAVENOUS

## 2022-12-14 MED ORDER — PROPOFOL 10 MG/ML IV EMUL (WRAP)
INTRAVENOUS | Status: AC
Start: 2022-12-14 — End: ?
  Filled 2022-12-14: qty 20

## 2022-12-14 MED ORDER — FENTANYL CITRATE (PF) 50 MCG/ML IJ SOLN (WRAP)
25.0000 ug | INTRAMUSCULAR | Status: DC | PRN
Start: 2022-12-14 — End: 2022-12-14

## 2022-12-14 MED ORDER — CEFAZOLIN SODIUM 2 G IJ SOLR
INTRAMUSCULAR | Status: DC
Start: 2022-12-14 — End: 2022-12-14
  Filled 2022-12-14: qty 2000

## 2022-12-14 MED ORDER — LIDOCAINE HCL 1 % IJ SOLN
INTRAMUSCULAR | Status: AC
Start: 2022-12-14 — End: ?
  Filled 2022-12-14: qty 20

## 2022-12-14 MED ORDER — BENZOCAINE-MENTHOL MT LOZG (WRAP)
1.0000 | LOZENGE | OROMUCOSAL | Status: DC | PRN
Start: 2022-12-14 — End: 2022-12-14

## 2022-12-14 MED ORDER — FENTANYL CITRATE (PF) 50 MCG/ML IJ SOLN (WRAP)
INTRAMUSCULAR | Status: AC
Start: 2022-12-14 — End: ?
  Filled 2022-12-14: qty 2

## 2022-12-14 MED ORDER — LACTATED RINGERS IV SOLN
INTRAVENOUS | Status: DC
Start: 2022-12-14 — End: 2022-12-14

## 2022-12-14 MED ORDER — PROPOFOL INFUSION 10 MG/ML
INTRAVENOUS | Status: DC | PRN
Start: 2022-12-14 — End: 2022-12-14
  Administered 2022-12-14: 100 ug/kg/min via INTRAVENOUS

## 2022-12-14 MED ORDER — FAMOTIDINE 10 MG/ML IV SOLN (WRAP)
20.0000 mg | Freq: Once | INTRAVENOUS | Status: AC
Start: 2022-12-14 — End: 2022-12-14

## 2022-12-14 MED ORDER — SCOPOLAMINE 1 MG/3DAYS TD PT72
MEDICATED_PATCH | TRANSDERMAL | Status: DC
Start: 2022-12-14 — End: 2022-12-14
  Administered 2022-12-14: 1 via TRANSDERMAL
  Filled 2022-12-14: qty 1

## 2022-12-14 MED ORDER — MIDAZOLAM HCL 1 MG/ML IJ SOLN (WRAP)
INTRAMUSCULAR | Status: AC
Start: 2022-12-14 — End: ?
  Filled 2022-12-14: qty 2

## 2022-12-14 MED ORDER — ONDANSETRON HCL 4 MG/2ML IJ SOLN
INTRAMUSCULAR | Status: DC | PRN
Start: 2022-12-14 — End: 2022-12-14
  Administered 2022-12-14: 4 mg via INTRAVENOUS

## 2022-12-14 MED ORDER — LIDOCAINE HCL (PF) 2 % IJ SOLN
INTRAMUSCULAR | Status: AC
Start: 2022-12-14 — End: ?
  Filled 2022-12-14: qty 5

## 2022-12-14 MED ORDER — HYDROMORPHONE HCL 1 MG/ML IJ SOLN
0.5000 mg | INTRAMUSCULAR | Status: DC | PRN
Start: 2022-12-14 — End: 2022-12-14

## 2022-12-14 MED ORDER — SIMETHICONE 80 MG PO CHEW
80.0000 mg | CHEWABLE_TABLET | Freq: Four times a day (QID) | ORAL | Status: DC | PRN
Start: 2022-12-14 — End: 2022-12-14

## 2022-12-14 MED ORDER — FAMOTIDINE 10 MG/ML IV SOLN (WRAP)
INTRAVENOUS | Status: AC
Start: 2022-12-14 — End: 2022-12-14
  Administered 2022-12-14: 20 mg via INTRAVENOUS
  Filled 2022-12-14: qty 2

## 2022-12-14 MED ORDER — OXYCODONE HCL 5 MG PO TABS
5.0000 mg | ORAL_TABLET | Freq: Once | ORAL | Status: AC | PRN
Start: 2022-12-14 — End: 2022-12-14

## 2022-12-14 MED ORDER — PROPOFOL 10 MG/ML IV EMUL (WRAP)
INTRAVENOUS | Status: DC | PRN
Start: 2022-12-14 — End: 2022-12-14
  Administered 2022-12-14: 200 mg via INTRAVENOUS

## 2022-12-14 MED ORDER — OXYCODONE HCL 5 MG PO TABS
2.5000 mg | ORAL_TABLET | Freq: Four times a day (QID) | ORAL | 0 refills | Status: AC | PRN
Start: 2022-12-14 — End: 2022-12-21

## 2022-12-14 MED ORDER — FENTANYL CITRATE (PF) 50 MCG/ML IJ SOLN (WRAP)
INTRAMUSCULAR | Status: DC | PRN
Start: 2022-12-14 — End: 2022-12-14
  Administered 2022-12-14 (×3): 50 ug via INTRAVENOUS

## 2022-12-14 MED ORDER — PROPOFOL 10 MG/ML IV EMUL (WRAP)
INTRAVENOUS | Status: AC
Start: 2022-12-14 — End: ?
  Filled 2022-12-14: qty 100

## 2022-12-14 MED ORDER — BUPIVACAINE-EPINEPHRINE 0.5% -1:200000 IJ SOLN
INTRAMUSCULAR | Status: DC | PRN
Start: 2022-12-14 — End: 2022-12-14
  Administered 2022-12-14: 30 mL

## 2022-12-14 MED ORDER — METOCLOPRAMIDE HCL 5 MG/ML IJ SOLN
10.0000 mg | Freq: Once | INTRAMUSCULAR | Status: DC | PRN
Start: 2022-12-14 — End: 2022-12-14

## 2022-12-14 MED ORDER — OXYCODONE HCL 5 MG PO TABS
ORAL_TABLET | ORAL | Status: AC
Start: 2022-12-14 — End: 2022-12-14
  Administered 2022-12-14: 5 mg via ORAL
  Filled 2022-12-14: qty 1

## 2022-12-14 MED ORDER — ACETAMINOPHEN 500 MG PO TABS
ORAL_TABLET | ORAL | Status: AC
Start: 2022-12-14 — End: 2022-12-14
  Administered 2022-12-14: 1000 mg via ORAL
  Filled 2022-12-14: qty 2

## 2022-12-14 MED ORDER — DEXAMETHASONE SOD PHOSPHATE PF 10 MG/ML IJ SOLN
INTRAMUSCULAR | Status: DC | PRN
Start: 2022-12-14 — End: 2022-12-14
  Administered 2022-12-14: 4 mg via INTRAVENOUS

## 2022-12-14 SURGICAL SUPPLY — 60 items
ADHESIVE LIQUID WATERPROOF VIAL PREP NONSTAIN MASTISOL STYRAX GUM (Skin Closure) ×1 IMPLANT
ADHESIVE LQ STYRAX GUM MASTIC ALC MTHY (Skin Closure) ×1 IMPLANT
APPLICATOR CHLORAPREP 26 ML 70% ISOPROPYL ALCOHOL 2% CHLORHEXIDINE (Applicator) ×2 IMPLANT
APPLICATOR PRP 70% ISPRP 2% CHG 26ML (Applicator) ×2 IMPLANT
ATTACHMENT SMOKE EVACUATOR L15 FT (Procedure Accessories) ×1 IMPLANT
ATTACHMENT SMOKE EVACUATOR L15 FT VALLEYLAB (Procedure Accessories) ×1 IMPLANT
CLIP INTERNAL L3.2 MM MEDIUM LIGATE OPEN (Clips) ×1 IMPLANT
CLIP INTERNAL L3.2 MM MEDIUM LIGATE OPEN LIGACLIPÂ® TITANIUM SILVER (Clips) ×1 IMPLANT
CONTAINER SPEC 8OZ NS SNPON LID TRNLU (Suction) ×1 IMPLANT
CONTAINER SPEC LLDPE 16OZ W LF NS LEK (Procedure Accessories) ×1 IMPLANT
CONTAINER SPECIMEN C16 OZ WIDE LEAK SHATTER RESISTANT SNAP ON LID (Procedure Accessories) ×1 IMPLANT
COVER FLEXIBLE LIGHT HANDLE PLASTIC GREEN (Procedure Accessories) ×1 IMPLANT
COVER FLEXIBLE MEDLINE LIGHT HANDLE (Procedure Accessories) ×1 IMPLANT
COVER TRANSDUCER L244 CM X W15.2 CM (Other) ×1 IMPLANT
COVER TRANSDUCER L244 CM X W15.2 CM TELESCOPIC FOLD COLOR ELASTIC BAND (Other) ×1 IMPLANT
DEVICE ELECTROSURGICAL L10 FT CORD (Cautery) ×1 IMPLANT
DEVICE ELECTROSURGICAL L10 FT CORD HOLSTER HEX LOCK BLADE POWER (Cautery) ×1 IMPLANT
DRESSING SECUREMENT TEGADERM L4 1/2 IN X (Dressing) ×2 IMPLANT
DRESSING SECUREMENT TEGADERM L4 1/2 IN X W3 1/2 IN INTRAVENOUS FILM (Dressing) ×2 IMPLANT
DRESSING TRANSPARENT L4 3/4 IN X W4 IN (Dressing) ×1 IMPLANT
DRESSING TRANSPARENT L4 3/4 IN X W4 IN POLYURETHANE ADHESIVE (Dressing) ×1 IMPLANT
DYE TISSUE MARKING ASSORTED CDI FOIL (Other) ×1 IMPLANT
DYE TISSUE MARKING ASSORTED CDI FOIL SEAL (Other) ×1 IMPLANT
ELECTRODE ADULT PATIENT RETURN L9 FT REM POLYHESIVE ACRYLIC FOAM (Procedure Accessories) ×1 IMPLANT
ELECTRODE ELECTROSRGCL BLADE L2.75IN OD3/32 IN L.2IN STD SHAFT HEX LCK (Cautery) ×1 IMPLANT
ELECTRODE ELECTROSURGICAL BLADE L125 MM (Endoscopic Supplies) IMPLANT
ELECTRODE ELECTROSURGICAL BLADE L125 MM NEPTUNE E-SEP INSULATE COATED (Endoscopic Supplies) IMPLANT
ELECTRODE ELECTROSURGICAL BLADE L2.75 IN (Cautery) ×1 IMPLANT
ELECTRODE ELECTROSURGICAL BLADE L4 IN (Cautery) ×1 IMPLANT
ELECTRODE ELECTROSURGICAL BLADE L4 IN OD3/32 IN EDGE L.2 IN INSULATE (Cautery) ×1 IMPLANT
ELECTRODE PATIENT RETURN L9 FT VALLEYLAB (Procedure Accessories) ×1 IMPLANT
GLOVE SURGICAL 7 BIOGEL PI MICRO POWDER (Glove) ×2 IMPLANT
GLOVE SURGICAL 7 BIOGEL PI MICRO POWDER FREE BEAD CUFF MICRO ROUGHEN (Glove) ×2 IMPLANT
LEGGINGS SURGICAL L48 IN X W31 IN (Drape) ×1 IMPLANT
LEGGINGS SURGICAL L48 IN X W31 IN TELESCOPIC FOLD CUFF L6 IN (Drape) ×1 IMPLANT
NEEDLE  LOC BREAST 20GX5CM (Needle) ×1 IMPLANT
NEEDLE LOCALIZATION L5 CM OD20 GA KOPANS EASY THREAD WIRE STIFFEN (Needle) ×1 IMPLANT
PACK SRG LF STRL MAJ BRST WOM DISP (Pack) ×1 IMPLANT
PACK SURGICAL MAJOR BREAST MEDLINE INDUSTRIES, INC. (Pack) ×1 IMPLANT
PEN SURGICAL MARKING REGULAR SANDEL (Procedure Accessories) ×1 IMPLANT
PEN SURGICAL MARKING REGULAR SANDEL 2-IN-1 MARKER TIME OUT SLEEVE SKIN (Procedure Accessories) ×1 IMPLANT
SLEEVE SUCTION/IRRIGATION L125 MM (Sleeve) IMPLANT
SLEEVE SUCTION/IRRIGATION L125 MM NEPTUNE E-SEP (Sleeve) IMPLANT
SPONGE GAUZE L4 IN X W4 IN 12 PLY WOVEN (Dressing) ×1 IMPLANT
SPONGE GAUZE L4 IN X W4 IN 12 PLY WOVEN FOLD EDGE XRAY DETECT COTTON (Dressing) ×1 IMPLANT
SPONGE GAUZE L4 IN X W4 IN 16 PLY WOVEN (Procedure Accessories) ×1 IMPLANT
SPONGE GAUZE L4 IN X W4 IN 16 PLY WOVEN FOLD EDGE COTTON (Procedure Accessories) ×1 IMPLANT
STRIP SKIN CLOSURE L4 IN X W1/2 IN (Dressing) ×1 IMPLANT
STRIP SKIN CLOSURE L4 IN X W1/2 IN REINFORCE STERI-STRIP POLYESTER (Dressing) ×1 IMPLANT
SUTURE COATED VICRYL 2-0 CT-1 L27 IN (Suture) IMPLANT
SUTURE COATED VICRYL 2-0 CT-1 L27 IN BRAID COATED UNDYED ABSORBABLE (Suture) IMPLANT
SUTURE COATED VICRYL 2-0 SH L27 IN BRAID (Suture) IMPLANT
SUTURE COATED VICRYL 2-0 SH L27 IN BRAID COATED UNDYED ABSORBABLE (Suture) IMPLANT
SUTURE MONOCRYL 3-0 SH L27 IN (Suture) ×1 IMPLANT
SUTURE MONOCRYL 3-0 SH L27 IN MONOFILAMENT UNDYED ABSORBABLE (Suture) ×1 IMPLANT
SUTURE MONOCRYL 4-0 PS-2 L27 IN (Suture) ×1 IMPLANT
SUTURE MONOCRYL 4-0 PS-2 L27 IN MONOFILAMENT UNDYED ABSORBABLE (Suture) ×1 IMPLANT
SYRINGE 10 ML CONTROL CONCENTRIC TIP (Syringes, Needles) ×1 IMPLANT
SYRINGE 10 ML CONTROL CONCENTRIC TIP PYROGEN FREE DEHP FREE LOK (Syringes, Needles) ×1 IMPLANT
TRAY SURGICAL MAJOR BREAST (Pack) ×1 IMPLANT

## 2022-12-14 NOTE — Anesthesia Preprocedure Evaluation (Addendum)
Anesthesia Evaluation    AIRWAY    Mallampati: III    TM distance: >3 FB  Neck ROM: full  Mouth Opening:full   CARDIOVASCULAR    cardiovascular exam normal, regular and normal       DENTAL    no notable dental hx               PULMONARY    pulmonary exam normal and clear to auscultation     OTHER FINDINGS    Labs reviewed:     HgB A1C 8.0                                       Relevant Problems   CARDIO   (+) Hypertension associated with diabetes      ENDO   (+) Type 2 diabetes mellitus without complication, without long-term current use of insulin               Anesthesia Plan    ASA 3     general                     Detailed anesthesia plan: general LMA        Post op pain management: per surgeon    informed consent obtained    Plan discussed with CRNA.      pertinent labs reviewed               Signed by: Reginia Naas, MD 12/14/22 1:34 PM

## 2022-12-14 NOTE — Brief Op Note (Signed)
BRIEF OP NOTE    Date Time: 12/14/22 4:53 PM    Patient Name:   SUZIE, SEMAR (MRN: [id number])    Date of Operation:   12/14/2022     Providers Performing:   Surgeons and Role:     * Consuello Closs, MD - Primary     * Rance Muir, Amy, MD - Resident - Assisting    Surgical First Assistant(s):   None    Operative Procedure:   BILATERAL BREAST PARTIAL MASTECTOMY: 19301 (CPT)  BILATERAL PLACEMENT BREAST LOCALIZATION DEVICE, ULTRASOUND GUIDED: 54098 (CPT)    Preoperative Diagnosis:   Pre-Op Diagnosis Codes:      * Atypical ductal hyperplasia of right breast [N60.91]     * Atypical ductal hyperplasia of left breast [N60.92]    Postoperative Diagnosis:   Post-Op Diagnosis Codes:     * Atypical ductal hyperplasia of right breast [N60.91]     * Atypical ductal hyperplasia of left breast [N60.92]    Findings:   Right ultrasound guided wire localization of hydromark clip performed (images saved)  Right partial mastectomy performed with retrieval of clip (wire dislodged during dissection)    Left ultrasound guided wire localization of hydromark clip performed (images saved)  Left partial mastectomy performed with retrieval of clip and wire      Anesthesia:   general    Estimated Blood Loss:    10 mL    Implants:   * No implants in log *        Drains:   Drains: no            Specimens:     ID Type Source Tests Collected by Time Destination   1 : RIGHT BREAST PARTIAL MASTECTOMY Tissue Breast, Right SURGICAL PATHOLOGY Consuello Closs, MD 12/14/2022 1434    2 : LEFT BREAST PARTIAL MASTECTOMY Tissue Breast, Left SURGICAL PATHOLOGY Consuello Closs, MD 12/14/2022 1434    3 : LEFT BREAST ADDITIONAL MEDIAL MARGIN Tissue Breast, Left SURGICAL PATHOLOGY Consuello Closs, MD 12/14/2022 1500           Complications:   None     Signed by: Consuello Closs, MD                                                                           [facility]

## 2022-12-14 NOTE — Transfer of Care (Signed)
Anesthesia Transfer of Care Note    Patient: Yvonne Webb    Procedures performed: Procedure(s):  BILATERAL BREAST PARTIAL MASTECTOMY  PLACEMENT BREAST LOCALIZATION DEVICE, ULTRASOUND GUIDED    Anesthesia type: General LMA    Patient location:Phase I PACU    Last vitals:   Vitals:    12/14/22 1613   BP: 122/68   Pulse: 95   Resp: 12   Temp: 36.4 C (97.6 F)   SpO2: 100%       Post pain: Patient not complaining of pain, continue current therapy      Mental Status:sedated    Respiratory Function: tolerating face mask    Cardiovascular: stable    Nausea/Vomiting: patient not complaining of nausea or vomiting    Hydration Status: adequate    Post assessment: no apparent anesthetic complications, no reportable events, and no evidence of recall    Signed by: Margie Ege, CRNA  12/14/22 4:13 PM

## 2022-12-15 ENCOUNTER — Encounter: Payer: Self-pay | Admitting: Surgery

## 2022-12-16 NOTE — Anesthesia Postprocedure Evaluation (Signed)
Anesthesia Post Evaluation    Patient: Yvonne Webb    Procedure(s):  BILATERAL BREAST PARTIAL MASTECTOMY  PLACEMENT BREAST LOCALIZATION DEVICE, ULTRASOUND GUIDED    Anesthesia type: general    Last Vitals:   Vitals Value Taken Time   BP 134/71 12/14/22 1710   Temp 36.5 C (97.7 F) 12/14/22 1710   Pulse 80 12/14/22 1710   Resp 16 12/14/22 1710   SpO2 100 % 12/14/22 1710                 Anesthesia Post Evaluation:     Patient Evaluated: PACU  Patient Participation: complete - patient participated  Level of Consciousness: awake and alert    Pain Management: adequate  Multimodal analgesia pain management approach  Strategies: acetaminophen and local anesthesia  Airway Patency: patent  Two or more mitigation strategies used for obstructive sleep apnea.  Strategies: multimodal analgesia and extubation/recovery carried out in nonsupine position    Anesthetic complications: No      PONV Status: none    Cardiovascular status: acceptable  Respiratory status: acceptable, room air and spontaneous ventilation  Hydration status: acceptable          Signed by: Reginia Naas, MD, 12/16/2022 3:21 AM

## 2022-12-18 NOTE — Op Note (Addendum)
 ADDENDUM:  Amy Jeng,MD acted as an Geophysicist/field seismologist at surgery. An assistant at surgery was necessary to aid me in patient positioning, operative field retraction and exposure, aiding in incision closure and dressing application, any other operative tasks required for patient safety. There was no qualified resident available for this procedure.    FULL OPERATIVE NOTE    Date Time: 12/18/22 10:52 AM  Patient Name: Yvonne Webb, Yvonne Webb (MRN: [id number])  Attending Physician: No att. providers found      Date of Operation:   12/14/2022    Providers Performing:   Surgeons and Role:     * Jolee Naval, MD - Primary     * Tomie Foyer, Amy, MD - Resident - Assisting    Surgical First Assistant(s):   None    Operative Procedure:   BILATERAL BREAST PARTIAL MASTECTOMY: 19301 (CPT)  PLACEMENT BREAST LOCALIZATION DEVICE, ULTRASOUND GUIDED: 54098 (CPT)    Preoperative Diagnosis:   Pre-Op Diagnosis Codes:      * Atypical ductal hyperplasia of right breast [N60.91]     * Atypical ductal hyperplasia of left breast [N60.92]    Postoperative Diagnosis:   Post-Op Diagnosis Codes:     * Atypical ductal hyperplasia of right breast [N60.91]     * Atypical ductal hyperplasia of left breast [N60.92]    Anesthesia:   general    Findings:   Right ultrasound guided wire localization of hydromark clip performed (images saved)  Right partial mastectomy performed with retrieval of clip (wire dislodged during dissection)     Left ultrasound guided wire localization of hydromark clip performed (images saved)  Left partial mastectomy performed with retrieval of clip and wire       Indications:   Yvonne Webb is a 57 y.o. patient with bilateral breast Atypical Ductal Hyperplasia.     Operative Notes:   PREOPERATIVE ASSESSMENT/HISTORY: Yvonne Webb is a 57 y.o. female who presents with a diagnosis of  Breast Atypical ductal hyperplasia of right breast [N60.91] and   Atypical ductal hyperplasia of left breast [N60.92].  Preoperatively she was seen in my office,  pathology and imaging was reviewed.  The patient was seen in the holding area, I marked the surgical site, and discussed operative plans. The patient was brought to the operating room, placed in supine position and identified.  Anesthesia was induced. Pre-operative antibiotics were given, bilateral lower extremity SCDs placed.  A safety time-out was performed correct side, correct, patient, correct procedure.     The patient's bilateral breast and axilla were prepped and draped in the normal sterile fashion. A complete surgical time out was performed.     We first localized the hydromark clip in the right breast. I used the intraoperative ultrasound to find the lesion marked by a HydroMark clip. The 5cm localization needle was placed under ultrasound guidance into the lesion and the hook was deployed. The needle was removed. Two views of the lesion showed the wire to be in place. The wire was trimmed. Images were saved.     We then localized the hydromark clip in the left breast. I used the intraoperative ultrasound to find the lesion marked by a HydroMark clip. The 5cm localization needle was placed under ultrasound guidance into the lesion and the hook was deployed. The needle was removed. Two views of the lesion showed the wire to be in place. The wire was trimmed. Images were saved.     We first turned our attention to the right breast. The area of interest and  planned incision were first marked out with a skin marker and then anesthetized with 1% lidocaine  mixed with 0.5% bupivicaine.  Using a #15 blade, a periareolar skin incision was made.  The subcutaneous tissue was divided using cautery while attention was paid to ensure hemostasis.  Retractors were inserted for exposure and the dissection was carried out to the thick part of the wire which was then delivered into the wound with two hemostats. Using the wire as a guide, dissection around the area of interest was carried out with cautery.     After the  specimen was excised, it was passed off for a specimen radiograph using the Faxitron.  This demonstrated retrieval of the area of concern and the clip. The wire was dislodged during dissection.    There appeared to be at least mammographically normal tissue along the entire rim. The specimen was painted with the margin marker set for orientation. The specimen was sent to pathology.       We then turned our attention to the left breast. The area of interest and planned incision were first marked out with a skin marker and then anesthetized with 1% lidocaine  mixed with 0.5% bupivicaine.  Using a #15 blade, a periareolar skin incision was made.  The subcutaneous tissue was divided using cautery while attention was paid to ensure hemostasis.  Retractors were inserted for exposure and the dissection was carried out to the thick part of the wire which was then delivered into the wound with two hemostats. Using the wire as a guide, dissection around the area of interest was carried out with cautery.     After the specimen was excised, it was passed off for a specimen radiograph using the Faxitron.  This demonstrated retrieval of the area of concern, the wire, and the clip.  There appeared to be at least mammographically normal tissue along the entire rim. The specimen was painted with the margin marker set for orientation. The specimen was sent to pathology.     Consequently, the operative fields were inspected for adequacy of hemostasis and irrigated. We then proceeded to close the lumpectomy cavity in layers. The operative cavities were closed with a 2-0 Vicryl interrupted sutures. We then proceeded to close the skin of the breast sites in two layers. The deep dermis was approximated using interrupted inverted simple sutures of 3-0 Monocryl and the skin closed with subcuticular suture of 4-0 Monocryl. The incisions were then cleansed, dried, and dressed with Mastisol and Steri-Strips before placement of surgical bra.      The patient tolerated the procedures well. Sponge, needle, and instrument counts were all correct at the end of the procedures. The patient was brought back to the PACU at the end of the procedures, extubated, awake, and in good condition. Estimated blood loss was minimal and there were no complications.  I was present for all portions of this operation.    Margin Inking Scheme   Anterior:  Orange  Posterior (Deep):  Black  Superior:  Blue  Inferior:  Green  Lateral:  Yellow  Medial:  Red                   Estimated Blood Loss:   10 mL    Implants:   * No implants in log *       Drains:   Drains: no      Specimens:     ID Type Source Tests Collected by Time Destination   1 : RIGHT  BREAST PARTIAL MASTECTOMY Tissue Breast, Right SURGICAL PATHOLOGY Jolee Naval, MD 12/14/2022 1434    2 : LEFT BREAST PARTIAL MASTECTOMY Tissue Breast, Left SURGICAL PATHOLOGY Jolee Naval, MD 12/14/2022 1434    3 : LEFT BREAST ADDITIONAL MEDIAL MARGIN Tissue Breast, Left SURGICAL PATHOLOGY Jolee Naval, MD 12/14/2022 1500          Complications:   None    Signed by: Jolee Naval, MD  [facility]

## 2022-12-20 LAB — SURGICAL PATHOLOGY

## 2022-12-28 NOTE — Progress Notes (Signed)
[facility]  POST OPERATIVE VISIT NOTE    Radiology Facility: [facility])     Subjective:  Yvonne Webb is a 57 y.o. patient with bilateral breast Atypical Ductal Hyperplasia who had a bilateral partial mastectomy on 12/14/2022.     She reports discomfort at the surgical site and firm tissue at the surgical site.  She is otherwise active . She is tolerating her diet well. Pathology results as below.      Physical Exam:  BP (!) 158/104 (BP Site: Left arm, Patient Position: Sitting)   Pulse 80   Resp 17   Ht 1.727 m (5\' 8" )   Wt 89.4 kg (197 lb)   SpO2 97%   BMI 29.95 kg/m   General: NAD, alert and oriented  HEENT: No scleral icterus, EOMI  Chest:  Normal respiratory effort  Abdomen: non-distended  Extremities:  MAE, no edema, normal ROM  Neuro:  Grossly normal, CN intact    Breast Exam:  Ptosis Grade 3.     Right Breast:  Well-healed periareolar incision , Dense tissue palpable at surgical site. There is no evidence of infection, hematoma or seroma.The steri-Strips were removed    Left Breast:  Well-healed periareolar incision , Dense tissue palpable at surgical site. There is no evidence of infection, hematoma or seroma.The steri-Strips were removed    Final Pathology - 12/14/2022    Right Breast  Benign mammary tissue with microcysts, focal atypical ductal hyperplasia, usual ductal hyperplasia, apocrine metaplasia, radial scar, and stromal fibrosis.  -No in situ or invasive breast carcinoma identified  -Prior biopsy site changes with clip  -Microcalcification presenting and benign breast tissue      Left Breast  Benign mammary tissue with microcysts, usual ductal hyperplasia, and stromal fibrosis  - No in situ or invasive breast carcinoma identified  -Prior biopsy site changes with clip  -Microcalcification presenting and benign breast tissue          Assessment/Plan   1. Atypical ductal hyperplasia of right breast  - Mammo Digital Diagnostic Bilateral w/CAD; Future  - Mammo  Digital Diagnostic Bilateral w/CAD    2. At high risk for breast cancer  - Mammo Digital Diagnostic Bilateral w/CAD; Future  - Mammo Digital Diagnostic Bilateral w/CAD    3. Atypical ductal hyperplasia of left breast  - Mammo Digital Diagnostic Bilateral w/CAD; Future  - Mammo Digital Diagnostic Bilateral w/CAD    4. Encounter for screening mammogram for malignant neoplasm of breast  - Mammo Digital Diagnostic Bilateral w/CAD; Future  - Mammo Digital Diagnostic Bilateral w/CAD        Yvonne Webb is a 57 y.o. patient with bilateral breast Atypical Ductal Hyperplasia who had a bilateral partial mastectomy on 12/14/2022.     Clinically, she is doing well.  There is no evidence of malignancy.  I have given the patient a copy of the pathology    She should call me if she develops any signs of infection such as increasing pain, redness, swelling, or drainage.  She was further advised that a healing ridge in this area is normal and will take up to three months to resolve.      Treatment Plan  Follow-up imaging: She will need alternating MRIs and Mammograms. She is due for a bilateral diagnostic mammogram in April 2025 (last bilateral mammogram on 08/16/2022). She will also need follow-up MRIs.  Her next MRI should be completed 6 months after her follow-up mammogram (October 2025)  Medical oncology: I  have discussed referring her to  medical oncology to discuss risk reducing medications (endocrine therapy/chemoprophylaxis).  She is interested in a consultation.  Follow-up: She should follow-up in our clinic in 1 year with one of our APPs for high risk screening follow-up.  I will be available if she develops any new surgical issues or desires to see me in the future.       We also discussed the approximately 1 % per year future risk of breast cancer development. We have briefly reviewed long term management strategies:    1. Enhanced surveillance to include a combination of clinical breast exam every 6-12 months, annual  screening mammogram, and annual screening breast MRI (6 months after mammogram). She can follow-up with the nurse practitioner in our breast center or she can be managed by her primary care physician.     2. Lifestyle Risk Reduction: The patient should also consider lifestyle risk reduction for breast cancer including regular exercise (at least 180 of moderate-intensity aerobic activity with strength training weekly), minimizing alcohol intake (1 or less drinks daily), and a The evidence for dietary guidelines is less clear in related to risk reduction, as controlled studies related to diet can difficult to conduct. Following healthy dietary patterns and eating food high in nutritious value such as vegetables and fruits (at least five servings/ day) and high-fiber carbohydrates (good sources of fiber are beans, vegetables, whole grains, and fruits), as well as limiting consumption of saturated fats and trans-saturated fats and limiting the consumption of sugary products will benefit overall health. They should abstain from smoking. They should also avoid Hormone replacement Therapy if able.     There are counseling and educational resources available through the [employer] [facility].     3.Chemoprophylaxis: I also discussed the role of chemoprophylaxis, which in general reduces the risk of breast cancer by approximately 50% according to the STAR Trial. Subgroup analysis revealed 86% risk reduction for women with ADH though. Women who meet criteria can take daily oral medications for 5 years that alter hormone levels in the body. If she is interested, I will refer her to medical oncology for further counseling.    4. Surgical Risk Reduction: Finally, I discussed the role of risk-reducing mastectomy. I explained that this procedure removes 90-95% of breast tissue, and correspondingly reduces the risk of breast cancer by about 90-95%. However, I do not recommend this option given her  lifetime risk does not justify prophylactic mastectomy.           Molli Hazard, MD  Breast Surgical Oncology  [facility]  T [phone]  F [phone]  [facility] Location:   [address]  [place], [place] [address]

## 2022-12-29 ENCOUNTER — Encounter (INDEPENDENT_AMBULATORY_CARE_PROVIDER_SITE_OTHER): Payer: Self-pay | Admitting: Surgery

## 2022-12-29 ENCOUNTER — Ambulatory Visit (INDEPENDENT_AMBULATORY_CARE_PROVIDER_SITE_OTHER): Payer: Commercial Managed Care - HMO | Admitting: Surgery

## 2022-12-29 ENCOUNTER — Telehealth: Payer: Self-pay

## 2022-12-29 VITALS — BP 158/104 | HR 80 | Resp 17 | Ht 68.0 in | Wt 197.0 lb

## 2022-12-29 DIAGNOSIS — Z9189 Other specified personal risk factors, not elsewhere classified: Secondary | ICD-10-CM

## 2022-12-29 DIAGNOSIS — N6092 Unspecified benign mammary dysplasia of left breast: Secondary | ICD-10-CM

## 2022-12-29 DIAGNOSIS — N6091 Unspecified benign mammary dysplasia of right breast: Secondary | ICD-10-CM

## 2022-12-29 DIAGNOSIS — Z1231 Encounter for screening mammogram for malignant neoplasm of breast: Secondary | ICD-10-CM

## 2022-12-29 NOTE — Telephone Encounter (Addendum)
Patient referred to Dr. Gasper Lloyd [facility] for chemoprophylactic treatment consult since patient would like to stay in [place] area.  Patient also is aware that she will call and scheduled her MRI at [facility] imaging center and will contact us with date of her appt.  Pending those images, surgery can then be finalized.    Spoke with Delia Heady at Dr. Melonie [place] office and requested that they call the patient for a consult appt. In [facility] office.      Email sent to pt to activate her mychart.

## 2022-12-31 ENCOUNTER — Encounter (INDEPENDENT_AMBULATORY_CARE_PROVIDER_SITE_OTHER): Payer: Self-pay | Admitting: Surgery

## 2023-01-03 ENCOUNTER — Telehealth: Payer: Self-pay

## 2023-01-03 NOTE — Telephone Encounter (Signed)
Clinical documents faxed to Dr. Melonie [place] office [phone].  Pt has an appt on 01/09/2023 for chemoprophylaxis evaluation.

## 2023-01-17 ENCOUNTER — Encounter: Payer: Self-pay | Admitting: Surgery

## 2023-02-28 ENCOUNTER — Encounter: Payer: Self-pay | Admitting: Surgery

## 2023-06-21 ENCOUNTER — Encounter (INDEPENDENT_AMBULATORY_CARE_PROVIDER_SITE_OTHER): Payer: Self-pay | Admitting: Surgery

## 2023-10-04 ENCOUNTER — Other Ambulatory Visit (INDEPENDENT_AMBULATORY_CARE_PROVIDER_SITE_OTHER): Payer: Self-pay | Admitting: Surgery

## 2023-10-09 ENCOUNTER — Telehealth: Payer: Self-pay

## 2023-10-09 ENCOUNTER — Ambulatory Visit: Payer: Self-pay

## 2023-10-09 NOTE — Telephone Encounter (Signed)
 LVM for pt to callback regarding her imaging results and f/u appt.

## 2023-10-09 NOTE — Progress Notes (Signed)
 Results reviewed with pt. F/U appt with NP sarah on 8/21. Call office sooner if new concerns arise.

## 2023-12-21 ENCOUNTER — Encounter: Payer: Self-pay | Admitting: Surgery

## 2024-01-01 ENCOUNTER — Ambulatory Visit (INDEPENDENT_AMBULATORY_CARE_PROVIDER_SITE_OTHER): Payer: Commercial Managed Care - HMO | Admitting: Family Nurse Practitioner

## 2024-01-01 ENCOUNTER — Encounter (INDEPENDENT_AMBULATORY_CARE_PROVIDER_SITE_OTHER): Payer: Self-pay | Admitting: Family Nurse Practitioner

## 2024-01-01 NOTE — Progress Notes (Unsigned)
 [facility] [place] [facility]  [phone]  Breast Surgical Oncology Consultation Report    Subjective:       Patient ID: Yvonne Webb is a 58 y.o. female    HPI  Bowen Goyal presents for follow-up for history of bilateral breast atypical ductal hyperplasia, status post bilateral partial mastectomy on 12/14/2022 with Dr. Viviann. She is currently taking Anastrozole 1 mg daily.    Today, she reports left nipple discharge on compression that is clear in color.  She notes one occurrence of brown nipple discharge on compression.  She denies spontaneous nipple discharge.  Denies other concerns on self breast exam.     Breast history:    Final Surgical pathology from bilateral partial mastectomy on 12/14/2022     Right Breast  Benign mammary tissue with microcysts, focal atypical ductal hyperplasia, usual ductal hyperplasia, apocrine metaplasia, radial scar, and stromal fibrosis.  -No in situ or invasive breast carcinoma identified  -Prior biopsy site changes with clip  -Microcalcification presenting and benign breast tissue  Left Breast  Benign mammary tissue with microcysts, usual ductal hyperplasia, and stromal fibrosis  - No in situ or invasive breast carcinoma identified  -Prior biopsy site changes with clip  -Microcalcification presenting and benign breast tissue    Patient followed by Ryan Stank, MD with [facility] for chemoprophylaxis.  Started anastrozole September 2024.     Recent Imaging:    BDM and targeted left breast ultrasound on 10/05/2023 to evaluate patient reported intermittent nonspontaneous clear, yellow and brown left nipple discharge:  Targeted ultrasound of the left breast performed to evaluate the patient  reported nipple discharge. No suspicious intraductal or parenchymal finding  is seen in the retroareolar left breast.  IMPRESSION:   1. No mammographic evidence of malignancy in either breast.  2. No sonographic abnormality in the retroareolar left breast. Further  management  of patient's nonspontaneous left nipple discharge should be  based on the clinical evaluation. Recommend that the patient follow up with  her breast surgeon.  BI-RADS 2    The following portions of the patient's history were reviewed and updated as appropriate: allergies, current medications, past family history, past medical history, past social history, past surgical history, and problem list.    Family History   Problem Relation Age of Onset    Hypertension Mother      Gynecological History   Age of menarche:: 71   Breast pain:: No     Age of menopause:: 72   Birth Control pills:: No     Birth Control pills:: No   G:: 4     P:: 5   Hormone Replacement therapy:: No     Age at first delivery:: 24          Last mammogram:: 10/04/23   Marital status:: Married     Nipple discharge:: No   Ethnicity:: African-American       ROS- see scan      Objective:     Vitals:    01/03/24 1001   BP: 126/84   Pulse: 86   Resp: 16   SpO2: 95%     Physical Exam  HENT:      Head: Normocephalic.   Eyes:      Pupils: Pupils are equal, round, and reactive to light.   Pulmonary:      Effort: Pulmonary effort is normal.   Chest:   Breasts:     Right: No swelling, bleeding, inverted nipple, mass, nipple discharge, skin  change or tenderness.      Left: No swelling, bleeding, inverted nipple, mass, nipple discharge, skin change or tenderness.      Comments: Well healed bilateral periareolar incision sites  Musculoskeletal:         General: Normal range of motion.      Cervical back: Normal range of motion.   Lymphadenopathy:      Upper Body:      Right upper body: No supraclavicular, axillary or pectoral adenopathy.      Left upper body: No supraclavicular, axillary or pectoral adenopathy.   Skin:     General: Skin is warm and dry.   Neurological:      Mental Status: She is alert and oriented to person, place, and time.   Psychiatric:         Mood and Affect: Mood normal.         Judgment: Judgment normal.       Assessment:       Helen was  seen today for breast problem.    Diagnoses and all orders for this visit:    At high risk for breast cancer  -     Mammo Diagnostic w/Tomo Bilat; Future  -     MRI Breast W CAD Bilateral W WO Contrast; Future  -     Mammo Diagnostic w/Tomo Bilat  -     MRI Breast W CAD Bilateral W WO Contrast    Atypical ductal hyperplasia of left breast  -     Mammo Diagnostic w/Tomo Bilat; Future  -     MRI Breast W CAD Bilateral W WO Contrast; Future  -     Mammo Diagnostic w/Tomo Bilat  -     MRI Breast W CAD Bilateral W WO Contrast    Atypical ductal hyperplasia of right breast  -     Mammo Diagnostic w/Tomo Bilat; Future  -     MRI Breast W CAD Bilateral W WO Contrast; Future  -     Mammo Diagnostic w/Tomo Bilat  -     MRI Breast W CAD Bilateral W WO Contrast      Plan:      Clinically, the patient is stable with no evidence of malignancy.  Will continue high risk breast surveillance secondary to history of bilateral breast atypical ductal hyperplasia diagnosed in 2024.  Patient reports left breast nonspontaneous nipple discharge.  Recent diagnostic mammogram and targeted ultrasound without evidence of underlying mass or malignancy.  Encouraged patient to avoid compression of her nipples and to notify me if any spontaneous nipple discharge.    I recommend continued clinical breast exams every 6 months alternating between breast surgery and PCP/ GYN.  Serial imaging to include annual mammography and enhanced surveillance with MRI.     Encouraged risk reducing practices: healthy diet, maintain normal weight, exercise (150-200 minutes of moderate activity), reducing alcohol intake, avoid combination estrogen/progestin hormone replacement therapy after age 45, and self breast exams.      Bilateral screening mammogram May 2026  Breast MRI November 2025    Follow-up in one year, sooner if concerns   Continue to follow-up with PCP/GYN/ medical oncologist    Patient agrees with plan

## 2024-01-03 ENCOUNTER — Ambulatory Visit (INDEPENDENT_AMBULATORY_CARE_PROVIDER_SITE_OTHER): Payer: Commercial Managed Care - HMO | Admitting: Family Nurse Practitioner

## 2024-01-03 ENCOUNTER — Encounter (INDEPENDENT_AMBULATORY_CARE_PROVIDER_SITE_OTHER): Payer: Self-pay | Admitting: Family Nurse Practitioner

## 2024-01-03 VITALS — BP 126/84 | HR 86 | Resp 16 | Ht 67.5 in | Wt 187.0 lb

## 2024-01-03 DIAGNOSIS — N6092 Unspecified benign mammary dysplasia of left breast: Secondary | ICD-10-CM

## 2024-01-03 DIAGNOSIS — Z9189 Other specified personal risk factors, not elsewhere classified: Secondary | ICD-10-CM

## 2024-01-03 DIAGNOSIS — N6091 Unspecified benign mammary dysplasia of right breast: Secondary | ICD-10-CM

## 2024-01-03 NOTE — Patient Instructions (Signed)
 Breast MRI November 2025    Bilateral mammogram May 2026

## 2024-03-06 ENCOUNTER — Telehealth: Payer: Self-pay

## 2024-03-06 NOTE — Telephone Encounter (Signed)
 Patient called breast surgery triage line requesting copy of her mammogram order (due May 2026) and breast MRI order (due now) to be emailed to her. Patient does not have access to MyChart.

## 2024-04-22 ENCOUNTER — Other Ambulatory Visit: Payer: Self-pay | Admitting: Surgery

## 2024-04-25 ENCOUNTER — Ambulatory Visit: Payer: Self-pay

## 2024-04-25 NOTE — Progress Notes (Signed)
 MRI biopsy 2 areas scheduled for Jan 15th @ 8:30 [facility].

## 2024-04-25 NOTE — Progress Notes (Signed)
 Results reviewed with pt.  2 site MRI biopsy pending appt from [facility], will f/u with pt one biopsy is sched. Pt agreed with plan

## 2024-05-27 ENCOUNTER — Other Ambulatory Visit: Payer: Self-pay

## 2024-05-27 ENCOUNTER — Telehealth: Payer: Self-pay

## 2024-05-27 DIAGNOSIS — Z9189 Other specified personal risk factors, not elsewhere classified: Secondary | ICD-10-CM

## 2024-05-27 DIAGNOSIS — N6092 Unspecified benign mammary dysplasia of left breast: Secondary | ICD-10-CM

## 2024-05-27 DIAGNOSIS — R928 Other abnormal and inconclusive findings on diagnostic imaging of breast: Secondary | ICD-10-CM

## 2024-05-27 NOTE — Telephone Encounter (Signed)
 Per [facility], Yvonne Webb, the pt is scheduled for 2 site MRI biopsy today.  Order placed.

## 2024-05-29 ENCOUNTER — Other Ambulatory Visit: Payer: Self-pay | Admitting: Surgery

## 2024-06-04 ENCOUNTER — Other Ambulatory Visit: Payer: Self-pay | Admitting: Surgery

## 2024-06-05 ENCOUNTER — Ambulatory Visit: Payer: Self-pay

## 2024-06-05 NOTE — Progress Notes (Signed)
 Results reviewed with pt. F/U with Dr. Viviann to discuss plan on 2/2 in [facility].

## 2024-06-14 NOTE — Progress Notes (Unsigned)
 [facility]  [address] [place]  [phone]    Breast Surgical Oncology follow-up      Chief complaint : Past history of bilateral atypical ductal hyperplasia, new diagnosis of left breast ADH    Referring Provider: Waddell Hough, NP    Radiology Facility: [facility])     History of Present Illness:     Ms Yvonne Webb is a 59 y.o. female with a history of bilateral breast ADH patient who presents for evaluation and management of a new site of left breast atypical ductal  hyperplasia.      She underwent bilateral lumpectomy on 12/14/2022 for bilateral atypical ductal hyperplasia.  She is followed by  Ryan Stank, MD with [facility] for chemoprophylaxis.  Started anastrozole September 2024.     Insert surgery, she developed left nonspontaneous nipple discharge.  This only occurred when she was manipulated her nipple.  Mammogram and ultrasound in May 2025 were negative for any abnormalities.  She then underwent high risk screening MRI, which demonstrated a significant area of non-mass enhancement.  She underwent 2 site biopsy with the posterior site demonstrating focal atypical ductal hyperplasia.    She presents today to discuss management.    Final Pathology - 12/14/2022     Right Breast  Benign mammary tissue with microcysts, focal atypical ductal hyperplasia, usual ductal hyperplasia, apocrine metaplasia, radial scar, and stromal fibrosis.  -No in situ or invasive breast carcinoma identified  -Prior biopsy site changes with clip  -Microcalcification presenting and benign breast tissue        Left Breast  Benign mammary tissue with microcysts, usual ductal hyperplasia, and stromal fibrosis  - No in situ or invasive breast carcinoma identified  -Prior biopsy site changes with clip  -Microcalcification presenting and benign breast tissue       The patient underwent the following workup.  The results and reports were reviewed and scanned into Epic.        10/05/2023-  Diagnostic Mammogram and Ultrasound:       04/22/2024- MRI:   Indication: Previous bilateral surgical biopsy, bilaterally ADH status post excision, nonspontaneous left nipple discharge    - Right Breast:   No suspicious mass or nonmass enhancement in the right breast. Postsurgical  changes from right upper outer and left upper outer middle depth surgical  excisional biopsies.  - Left Breast:   There is regional left breast superior anterior to posterior nonmass enhancement measuring approximately 70 mm, appears asymmetric to the contralateral right breast. Area is mildly T2 hypointense. Areas of washout kinetics. Please note that there are other areas of possible nonmass enhancement extending towards the nipple areolar complex (series [id number] sagittal image [id number]).    - There is mild ductal dilatation in the left subareolar breast with intrinsic T1 shortening most consistent with proteinaceous secretion.   - Lymph Nodes: No evidence of axillary lymphadenopathy. Visualized soft tissues are normal  in signal characteristics.  Impression: Regional left superior breast non-mass enhancement extending toward the nipple in a patient with nipple discharge. Recommend 2 site left MRI biopsy, the biopsies can be done at the same time.      05/29/2024 - MRI Guided Biopsy  - Location: Left breast, retroareolar, anterior non-mass enhancement  - Clip: Open Coil Hydromark  - Post Biopsy Imaging: Clip is at the biopsy site    Pathology: Duct dilatation and non-proliferative breast changes characterized by adenosis, columnar cell change, cystic apocrine metaplasia, and luminal calcifications (calcium oxalate  crystals).  Size: 7.0 cm non-mass enhancement      05/29/2024 - MRI Guided Biopsy  - Location: Left breast, central, posterior non-mass enhancement  - Clip: Butterfly Hydromark  - Post Biopsy Imaging: The clip has migrated 2 cm medially    Pathology: Focal atypical ductal hyperplasia (ADH), apocrine metaplasia with atypia columnar  cell change, microcysts and luminal calcifications (calcium phosphate and calcium oxalate crystals).  Size: 7.0 cm of non-mass enhancement  Recommendation:   1.  Surgical consult for management of the biopsy-proven atypia.  2.  If surgical excision is pursued, recommend patient return for ultrasound-guided Marker placement at the site of the biopsy-proven ADH.             Medications:  Current medications  Current Medications[1]     Allergies:  Allergies[2]     Past Medical History:  Medical History[3]     Past Surgical History:  Past Surgical History[4]     GYN History:    Gynecological History   Age of menarche:: 87   Breast pain:: No     Age of menopause:: 57   Birth Control pills:: No     Birth Control pills:: No   G:: 4     P:: 5   Hormone Replacement therapy:: No     Age at first delivery:: 56          Last mammogram:: 10/04/23   Marital status:: Married     Nipple discharge:: No   Ethnicity:: African-American           Past Family History:  Shaolin's family history includes Hypertension in her mother. .    Past Social History:   Pierre Social History[5].  History was reviewed with patient and/or family.       Physical Exam: 06/16/2024  BP (!) 147/99   Pulse (!) 102   Temp 98.5 F (36.9 C)   SpO2 95%     Constitutional: Well-appearing patient in no apparent distress  Head: Normocephalic and atraumatic.   Eyes: No scleral icterus.   Neck: Normal range of motion. Neck supple. No tracheal deviation or thyromegaly present.   Cardiovascular: Regular rate and rhythm, no murmurs.   Pulmonary/Chest: Clear to auscultation bilaterally. Effort normal.   Abdomen: Non-distended  Musculoskeletal: Good shoulder range of motion;  Spine curvature is normal and is non tender  Neurologic: Neurologically grossly intact  Skin: No obvious skin abnormalities  Lymphatic: No evidence of arm swelling  Psychiatric: pleasant/cooperative and appropriate    Physical Exam     Breast Exam: She is examined in both the upright and the supine  positions.   Both nipples are everted.  There are no secondary signs of malignancy on visual inspection of either breast.  There is no cervical, supraclavicular, or infraclavicular lymphadenopathy      RIGHT Breast: No palpable masses, no skin changes, no nipple inversion/retraction/discharge  RIGHT Axilla: No axillary lymphadenopathy     LEFT Breast: Well-healed periareolar incision, no palpable masses, biopsy site changes, HydroMARK clips visible at 11:00, 2 cm from the nipple, and 11:00, 8 cm from the nipple, no nipple inversion/retraction/discharge  LEFT Axilla: No axillary lymphadenopathy adenopathy.              Assessment/Plan   1. Atypical ductal hyperplasia of left breast  - US  Breast Left Ltd; Future        Yvonne Webb is a 59 y.o. patient with a history of bilateral breast Atypical Ductal Hyperplasia, new left nonspontaneous nipple discharge,  with recent MRI showing a 7 cm span of non-mass enhancement with posterior biopsy demonstrating an additional area of ADH.      Treatment Plan  Additional Imaging Needed: If we elect to undergo surgical intervention, she will need the clip replaced.  If we conclude that we do not need to proceed with surgical intervention, I will plan to obtain a 15-month follow-up MRI.  She will be due for her next bilateral mammogram in May 2026 (last mammogram 10/04/2023)  Surgical Plan: Pending tumor board discussion  If we elect to undergo surgical invention she will need a left lumpectomy with wire localization.   Pre-operative risk optimization: Per anesthesia guidelines  Follow-up: Pending tumor board discussion        Discussion  I had a discussion with the patient about the natural history of atypical ductal hyperplasia (ADH) using diagrams, which have been scanned into the patient's medical record.  ADH is a proliferative pattern of growth of cells but it is not a pre-cancer lesion but rather a marker which is linked to an increased risk of developing breast cancer in the  future.  The estimated rate of atypical ductal hyperplasia diagnosis on core needle biopsy has been reported at 3% in current literature with approximately 10-30% of cases upgraded to breast cancer. In agreement with national guidelines from the American Society of Breast Surgeons, I recommend that ADH as seen on her breast core needle biopsy merits surgical excision to evaluate for possible concurrent cancer.     I have explained the procedure, risks and benefits .  Potential complications include but are not limited to bleeding, infection, hematoma, chronic pain, chronic seroma, dissatisfaction with appearance of the breast or incision, recurrence of the mass, and need for additional surgery.  Most patients, however, experience only mild swelling , ecchymosis, or pain.      The surgery is an outpatient surgery.  You are discharged home the same day.  Pain is usually minimal.  I recommend the use of a supportive brassiere, and the use of schedule Tylenol  and a nonsteroidal anti-inflammatory agent such as ibuprofen or naproxen for 5 days.  A narcotic prescription for breakthrough pain can be prescribed.  You are expected to return to normal activity 2 weeks after surgery.      Even if her surgical biopsy remains benign, ADH carries an elevated lifetime risk of breast cancer. Post-operatively we will discuss strategies for enhanced surveillance and lifestyle and medical risk reduction.    This encounter included time spent preparing to see the patient, review of the past medical history, reviewing imaging, including mammogram, ultrasound and MRI, review of pathology , face to face time with the patient,  performing physical examination, discussing treatment and surgical options, and documentation in the patient record.  I have independently reviewed the above imaging and agree with the findings.       Arnette Rouse, MD  Breast Surgical Oncology  [facility]  T [phone]  F [phone]  [facility] Location:   [address]  [place], [place] [address]             [1]   Current Outpatient Medications:     amLODIPine (NORVASC) 5 MG tablet, TAKE 1 TABLET BY MOUTH ONCE DAILY FOR 90 DAYS, Disp: , Rfl:     metFORMIN (GLUCOPHAGE) 500 MG tablet, Take 1 tablet (500 mg) by mouth 2 (two) times daily, Disp: , Rfl:     VITAMIN D PO, Take by  mouth, Disp: , Rfl:     ferrous sulfate 325 (65 FE) MG tablet, Take 1 tablet (325 mg) by mouth every morning with breakfast, Disp: , Rfl:     losartan-hydrochlorothiazide (HYZAAR) 100-25 MG per tablet, , Disp: , Rfl:     triamterene-hydrochlorothiazide (MAXZIDE-25) 37.5-25 MG per tablet, TAKE 1 TABLET BY MOUTH ONCE DAILY FOR 90 DAYS, Disp: , Rfl:   [2]   Allergies  Allergen Reactions    Chloroquine Itching   [3]   Past Medical History:  Diagnosis Date    Anemia     Diabetes mellitus (CMS/HCC)     Hypertension     Type 2 diabetes mellitus, controlled (CMS/HCC)    [4]   Past Surgical History:  Procedure Laterality Date    MASTECTOMY, PARTIAL Bilateral 12/14/2022    Procedure: BILATERAL BREAST PARTIAL MASTECTOMY;  Surgeon: Viviann Arnette SQUIBB, MD;  Location: [facility];  Service: General;  Laterality: Bilateral;    PLACEMENT BREAST LOCALIZATION DEVICE, ULTRASOUND GUIDED Bilateral 12/14/2022    Procedure: PLACEMENT BREAST LOCALIZATION DEVICE, ULTRASOUND GUIDED;  Surgeon: Viviann Arnette SQUIBB, MD;  Location: [facility];  Service: General;  Laterality: Bilateral;    TUBAL LIGATION     [5]   Social History  Socioeconomic History    Marital status: Married   Tobacco Use    Smoking status: Never     Passive exposure: Never    Smokeless tobacco: Never   Vaping Use    Vaping status: Never Used   Substance and Sexual Activity    Alcohol use: Yes     Comment: occ    Drug use: Never     Social Drivers of Health     Food Insecurity: No Food Insecurity (12/14/2022)    Hunger Vital Sign     Worried About Running Out of Food in the Last Year: Never true     Ran Out of  Food in the Last Year: Never true   Transportation Needs: No Transportation Needs (12/14/2022)    PRAPARE - Therapist, Art (Medical): No     Lack of Transportation (Non-Medical): No   Intimate Partner Violence: Not At Risk (12/14/2022)    Humiliation, Afraid, Rape, and Kick questionnaire     Fear of Current or Ex-Partner: No     Emotionally Abused: No     Physically Abused: No     Sexually Abused: No   Housing Stability: Low Risk (12/14/2022)    Housing Stability Vital Sign     Unable to Pay for Housing in the Last Year: No     Number of Times Moved in the Last Year: 1     Homeless in the Last Year: No

## 2024-06-14 NOTE — Procedures (Unsigned)
 Procedure Note  Diagnostic Breast Ultrasound    Surgeon: Arnette Rouse, MD  Procedure: Unilateral breast diagnostic ultrasound  Indications: Left breast preoperative evaluation to determine surgical localization    Report:  With the patient in the supine position and the arm abducted the target area(s) was/were located and examined. A SonoSite Ultrasound, using a HFL50XP/15-6 breast linear transducer was used.    Breast laterality:  Left  Area scanned: Central breast    Findings:  Ultrasound interrogation of the central breast demonstrated the previously placed Hydro mark clips at the sites of biopsy proven benign biopsy and atypical ductal hyperplasia.   The previously placed Hydro mark clips are visualized at 11:00, 2 cm from the nipple (benign biopsy), AND 11:00, 8 cm from the nipple (ADH - Clip migrated 2cm medially)      Impression/Plan:   Sonographic hydro Mark clips are identified by ultrasound.        Pictures were taken.This was discussed with the patient at length.            Arnette Rouse, MD  Breast Surgical Oncology  [facility]  T [phone]  F [phone]  [facility] Location:   [address]  [place], [place] [address]

## 2024-06-16 ENCOUNTER — Ambulatory Visit (INDEPENDENT_AMBULATORY_CARE_PROVIDER_SITE_OTHER): Admitting: Surgery

## 2024-06-16 ENCOUNTER — Other Ambulatory Visit (INDEPENDENT_AMBULATORY_CARE_PROVIDER_SITE_OTHER): Payer: Self-pay

## 2024-06-16 ENCOUNTER — Encounter (INDEPENDENT_AMBULATORY_CARE_PROVIDER_SITE_OTHER): Payer: Self-pay | Admitting: Surgery

## 2024-06-16 ENCOUNTER — Other Ambulatory Visit: Payer: Self-pay | Admitting: Surgery

## 2024-06-16 VITALS — BP 147/99 | HR 102 | Temp 98.5°F

## 2024-06-16 DIAGNOSIS — N6091 Unspecified benign mammary dysplasia of right breast: Secondary | ICD-10-CM

## 2024-06-16 DIAGNOSIS — N6092 Unspecified benign mammary dysplasia of left breast: Secondary | ICD-10-CM

## 2024-06-19 ENCOUNTER — Other Ambulatory Visit: Payer: Self-pay | Admitting: Surgery

## 2024-06-19 DIAGNOSIS — Z9189 Other specified personal risk factors, not elsewhere classified: Secondary | ICD-10-CM

## 2024-06-19 DIAGNOSIS — N6091 Unspecified benign mammary dysplasia of right breast: Secondary | ICD-10-CM

## 2025-01-08 ENCOUNTER — Ambulatory Visit (INDEPENDENT_AMBULATORY_CARE_PROVIDER_SITE_OTHER): Admitting: Family Nurse Practitioner
